# Patient Record
Sex: Male | Born: 1985 | Race: White | Hispanic: No | Marital: Married | Smoking: Former smoker
Health system: Southern US, Community
[De-identification: ages and names within clinical notes are randomized; demographics above are authoritative.]

## PROBLEM LIST (undated history)

## (undated) DIAGNOSIS — T7840XA Allergy, unspecified, initial encounter: Secondary | ICD-10-CM

## (undated) DIAGNOSIS — K900 Celiac disease: Secondary | ICD-10-CM

## (undated) DIAGNOSIS — T40601A Poisoning by unspecified narcotics, accidental (unintentional), initial encounter: Secondary | ICD-10-CM

## (undated) HISTORY — DX: Celiac disease: K90.0

## (undated) HISTORY — DX: Allergy, unspecified, initial encounter: T78.40XA

---

## 2010-08-02 IMAGING — CR DG FOOT COMPLETE 3+V*R*
3 series · 3 of 3 positions shown · non-contrast
Comparison: None.

CLINICAL DATA: Right foot pain.  Injury.

RIGHT FOOT COMPLETE - 3+ VIEW

[AP]
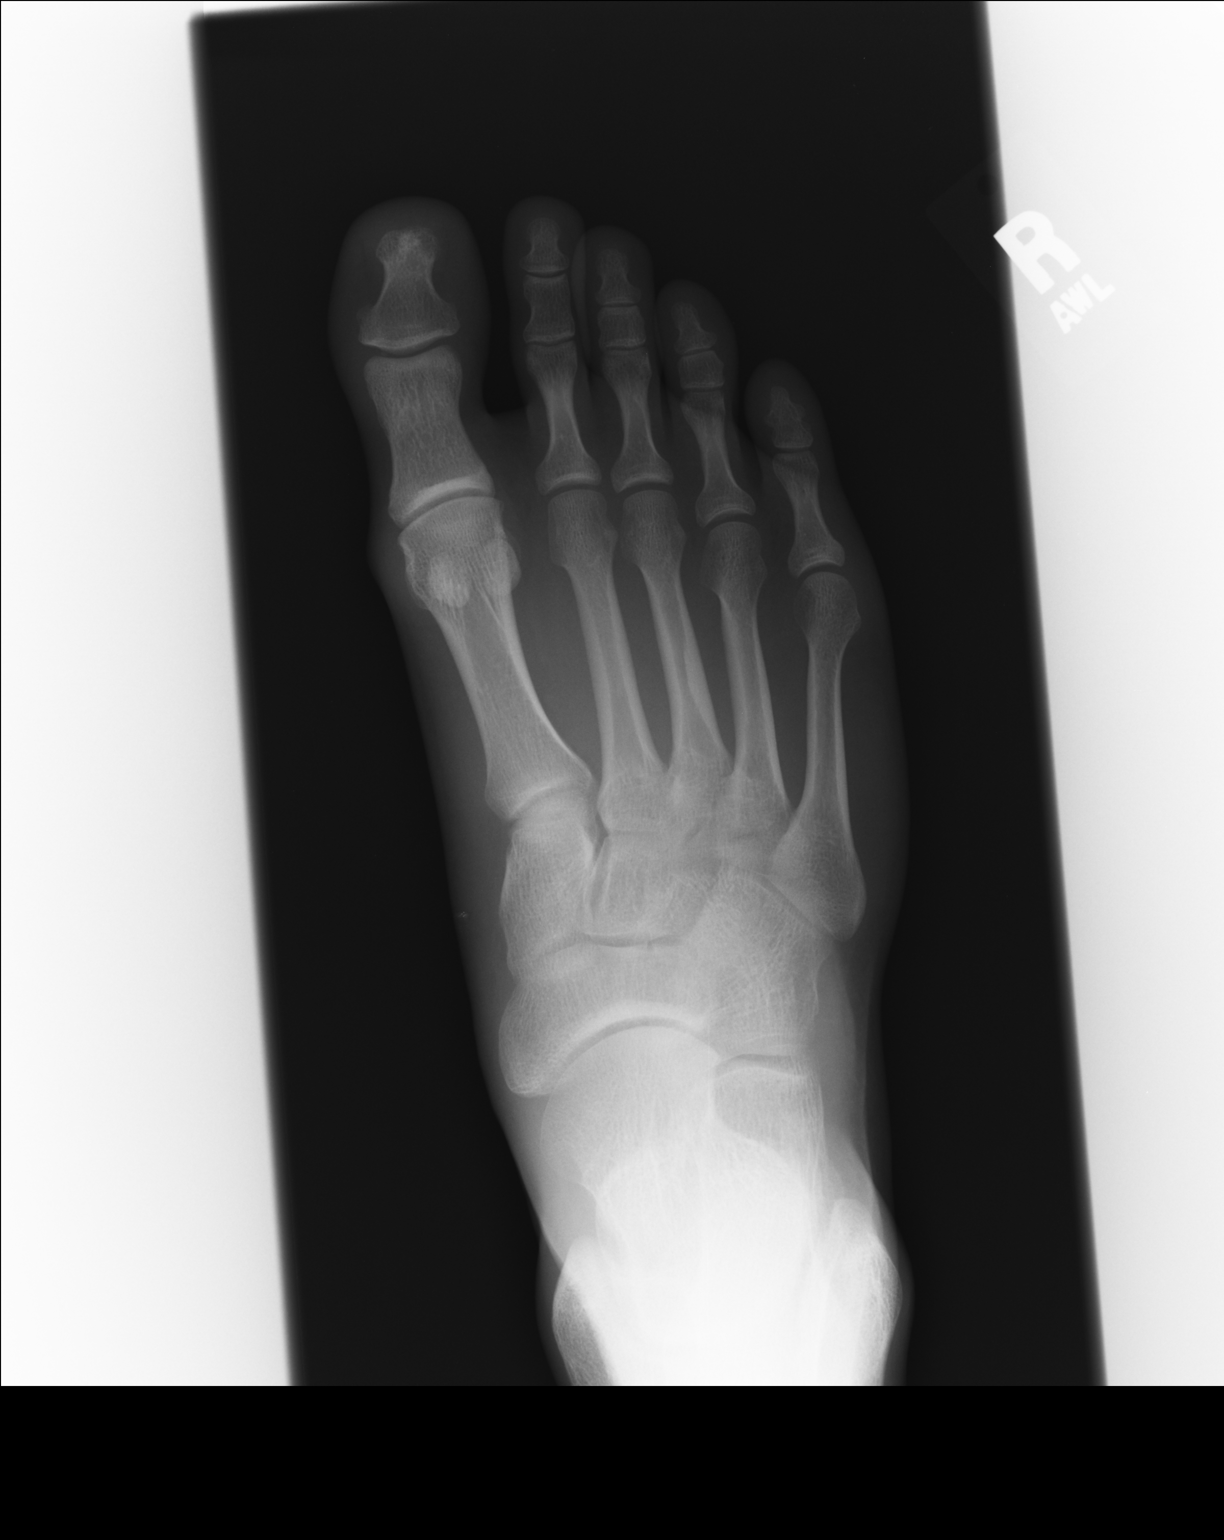

[ap obl int rot]
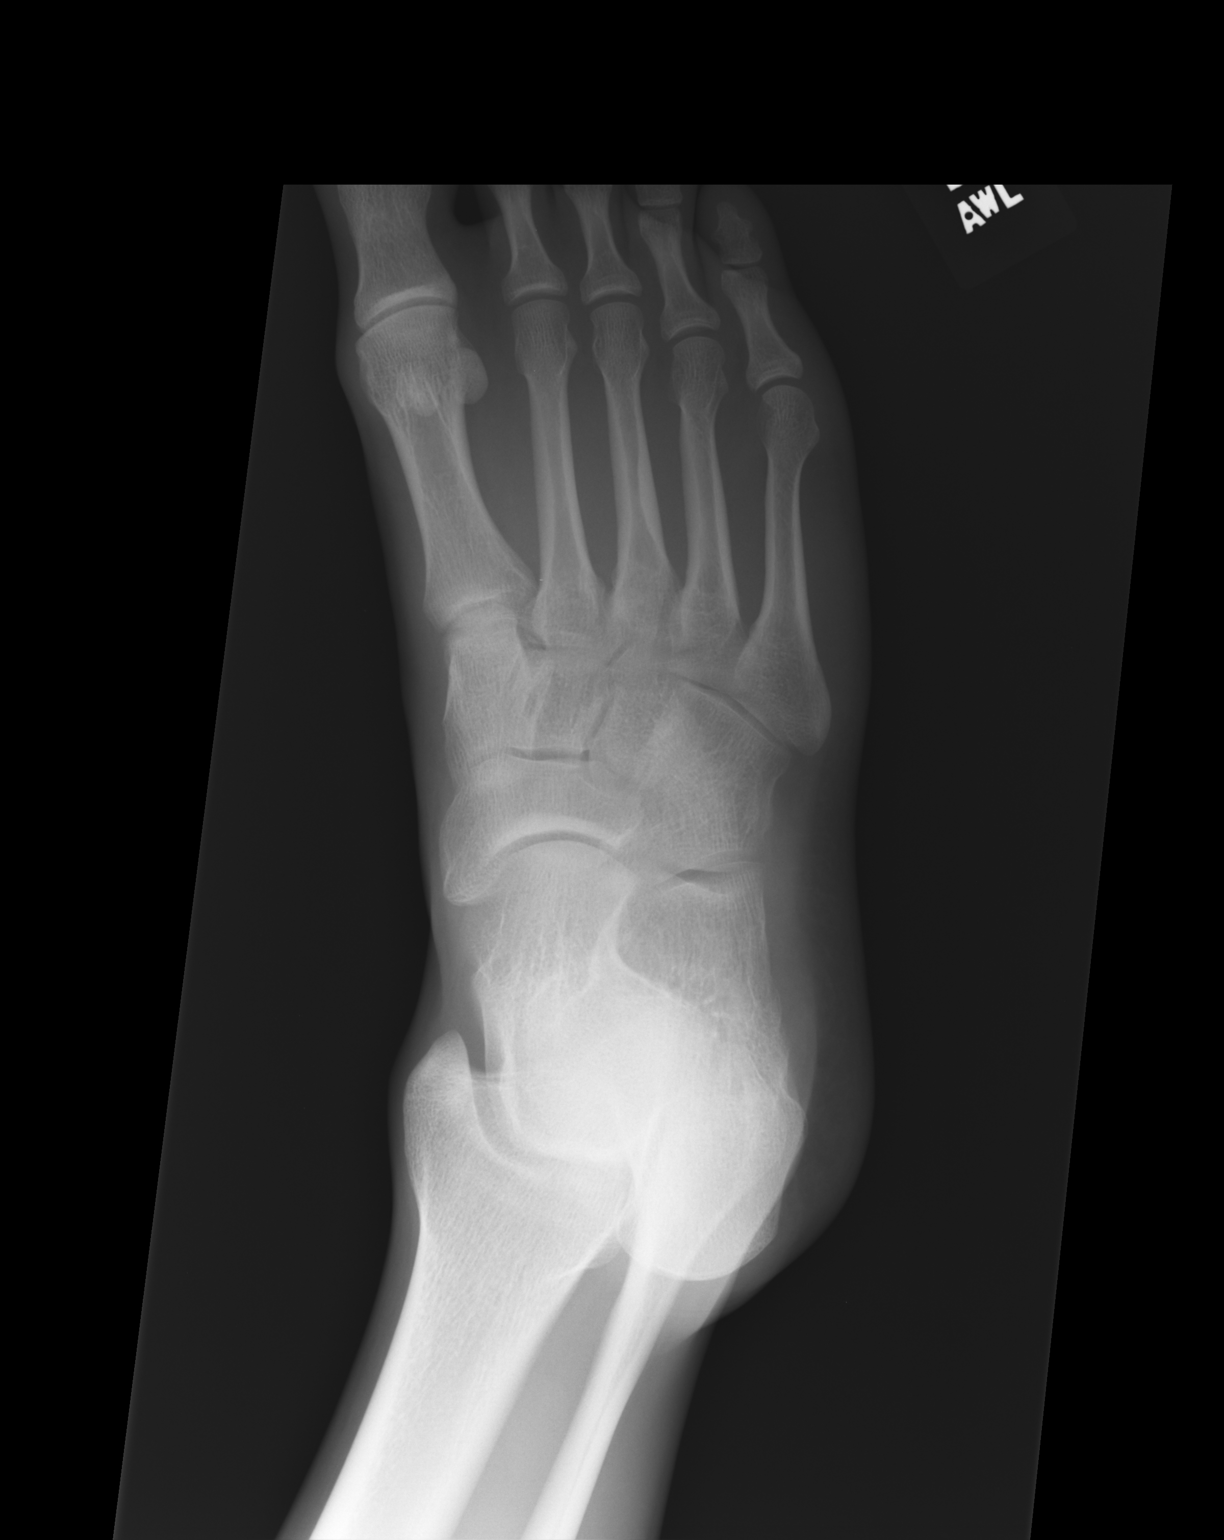

[lateral]
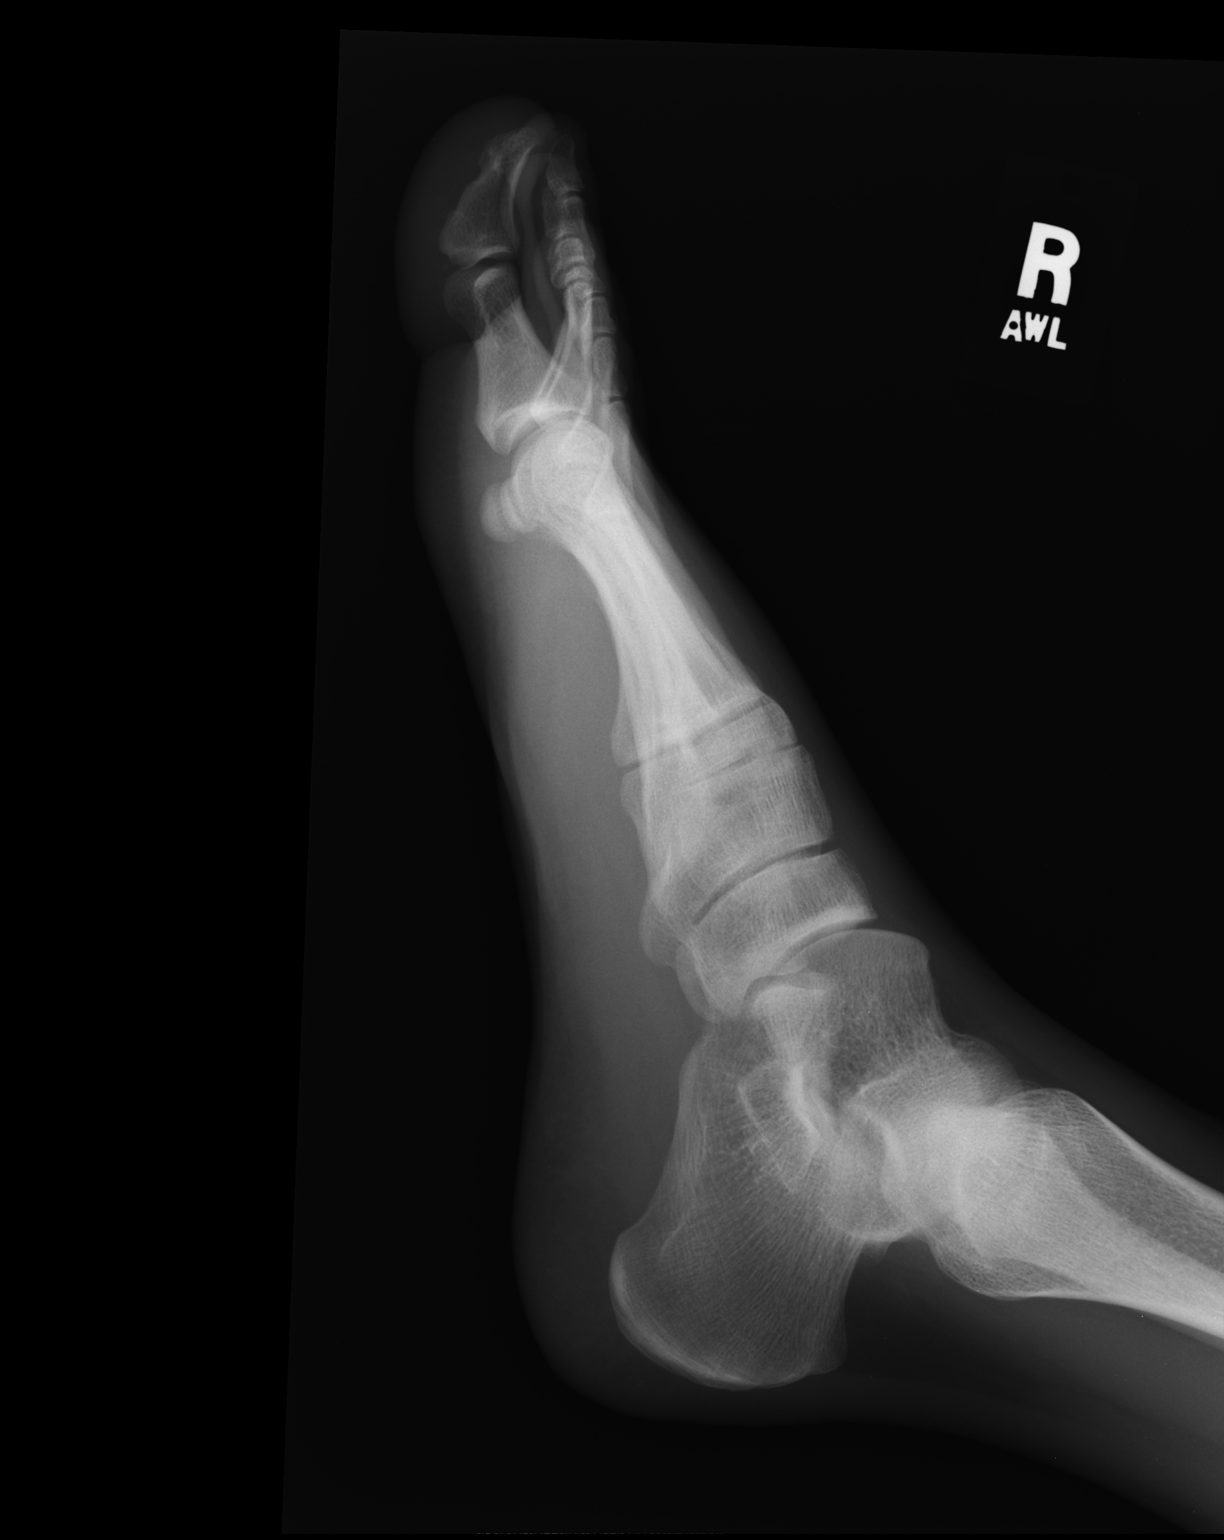

[3 of 3 positions shown; findings below may reference images not displayed]

FINDINGS: Imaged bones, joints and soft tissues appear normal.
IMPRESSION: Negative exam.

## 2011-06-30 ENCOUNTER — Ambulatory Visit: Payer: Self-pay | Admitting: Family Medicine

## 2011-06-30 ENCOUNTER — Ambulatory Visit: Payer: Self-pay

## 2011-06-30 VITALS — BP 122/78 | HR 71 | Temp 97.4°F | Resp 16 | Ht 68.0 in | Wt 144.0 lb

## 2011-06-30 DIAGNOSIS — M79609 Pain in unspecified limb: Secondary | ICD-10-CM

## 2011-06-30 DIAGNOSIS — M79673 Pain in unspecified foot: Secondary | ICD-10-CM

## 2011-06-30 NOTE — Progress Notes (Signed)
  Subjective:    Patient ID: Justin Bass, male    DOB: 21-Apr-1985, 26 y.o.   MRN: [id number]  HPI 26 yo male with right foot pain. Occurred after kicking something hard. Able to walk on it but very painful.  Decreased motioin.  Some swelling.   No history of prior injury.  Has been icing and elevating.  Unable to take ibuprofen (has celiac disease)   Review of Systems Negative except as per HPI     Objective:   Physical Exam  Constitutional: He appears well-developed.  Pulmonary/Chest: Effort normal.  Neurological: He is alert.   Right foot - TTP distal first and second MT.  Subtle swelling and erythema.  Decreased ROM.   [facility] Primary radiology reading by Dr. Georgiana Shore: No fracture seen      Assessment & Plan:  Foot pain - no fracture.  Contusion.  Post-op shoe.  Modified activity for 7-10 days.

## 2012-02-01 ENCOUNTER — Ambulatory Visit: Payer: Self-pay | Admitting: Family Medicine

## 2012-02-01 VITALS — BP 120/78 | HR 74 | Temp 97.9°F | Resp 16 | Ht 68.0 in | Wt 157.0 lb

## 2012-02-01 DIAGNOSIS — R05 Cough: Secondary | ICD-10-CM

## 2012-02-01 DIAGNOSIS — J9801 Acute bronchospasm: Secondary | ICD-10-CM

## 2012-02-01 DIAGNOSIS — J0110 Acute frontal sinusitis, unspecified: Secondary | ICD-10-CM

## 2012-02-01 MED ORDER — DOXYCYCLINE HYCLATE 100 MG PO CAPS: 100.0000 mg | ORAL_CAPSULE | Freq: Two times a day (BID) | ORAL | Status: DC

## 2012-02-01 MED ORDER — ALBUTEROL SULFATE (2.5 MG/3ML) 0.083% IN NEBU: 2.5000 mg | INHALATION_SOLUTION | Freq: Once | RESPIRATORY_TRACT | Status: DC

## 2012-02-01 NOTE — Progress Notes (Signed)
[address]   [place], [place]  [address]   [phone]  Subjective:    Patient ID: Justin Bass, male    DOB: 1985/06/26, 26 y.o.   MRN: [id number]  HPIThis 26 y.o. male presents for evaluation of sore throat.  Onset one week ago.  No fever but +chill/sweats.  +HA  No ear pain. +ST diffusely; +pain with swallowing.  No drooling or spitting.  +rhinorrhea yellow thick green.  +nasal congestion.  +coughing all day; +sputum;; sounds deep; sounds wheezing.  SOB with ambulation.  No vomiting or diarrhea.  No rash.  No sick contacts.  No flu vaccine this year.  Taking Mucinex, Nyquil.   Installs satellite dishes.  Quit smoking two months ago.    Review of Systems  Constitutional: Positive for chills and fatigue. Negative for fever.  HENT: Positive for congestion, sore throat, rhinorrhea, trouble swallowing and voice change. Negative for ear pain.   Respiratory: Positive for cough and shortness of breath. Negative for wheezing.   Gastrointestinal: Negative for nausea, vomiting, abdominal pain and diarrhea.  Skin: Negative for rash.  Neurological: Positive for headaches.        Past Medical History  Diagnosis Date  . Celiac disease   . Celiac disease   . Allergy     No past surgical history on file.  Prior to Admission medications   Medication Sig Start Date End Date Taking? Authorizing Provider  Omega-3 Fatty Acids (FISH OIL PO) Take by mouth.   Yes Historical Provider, MD    No Known Allergies  History   Social History  . Marital Status: Married    Spouse Name: N/A    Number of Children: N/A  . Years of Education: N/A   Occupational History  . Not on file.   Social History Main Topics  . Smoking status: Former Games developer  . Smokeless tobacco: Former Neurosurgeon    Quit date: 06/08/2011  . Alcohol Use: No  . Drug Use: No  . Sexually Active: Not on file   Other Topics Concern  . Not on file   Social History Narrative  . No narrative on file    No family history on  file.  Objective:   Physical Exam  Nursing note and vitals reviewed. Constitutional: He is oriented to person, place, and time. He appears well-developed and well-nourished. No distress.  HENT:  Head: Normocephalic and atraumatic.  Right Ear: External ear normal.  Left Ear: External ear normal.  Nose: Nose normal.  Mouth/Throat: Mucous membranes are normal. No uvula swelling. Posterior oropharyngeal erythema present. No oropharyngeal exudate, posterior oropharyngeal edema or tonsillar abscesses.  Eyes: Conjunctivae normal are normal. Pupils are equal, round, and reactive to light.  Neck: Normal range of motion. Neck supple.  Cardiovascular: Normal rate, regular rhythm and normal heart sounds.   No murmur heard. Pulmonary/Chest: Effort normal. No respiratory distress. He has wheezes. He has no rales.       SCANT B EXPIRATORY WHEEZING; NO TACHYPNEA; NO ACCESSORY MUSCLE USE.    Lymphadenopathy:    He has cervical adenopathy.  Neurological: He is alert and oriented to person, place, and time.  Skin: No rash noted. He is not diaphoretic.  Psychiatric: He has a normal mood and affect. His behavior is normal.    ALBUTEROL NEBULIZER ADMINISTERED DURING VISIT.    Assessment & Plan:   1. Sinusitis, acute frontal  doxycycline (VIBRAMYCIN) 100 MG capsule  2. Cough    3. Bronchospasm, acute  albuterol (PROVENTIL) (2.5  MG/3ML) 0.083% nebulizer solution 2.5 mg     1. Acute sinusitis/bronchitis:  New.  Treat with Doxycycline.  Recommend Mucinex DM or Claritin D for cough, congestion.  Supportive care with rest, fluids, Ibuprofen or Tylenol PRN. 2.  Bronchospasm:  New.  S/p Albuterol nebulizer in office. RTC for worsening or lack of improvement.     Meds ordered this encounter  Medications  . Omega-3 Fatty Acids (FISH OIL PO)    Sig: Take by mouth.  . doxycycline (VIBRAMYCIN) 100 MG capsule    Sig: Take 1 capsule (100 mg total) by mouth 2 (two) times daily.    Dispense:  20 capsule     Refill:  0  . albuterol (PROVENTIL) (2.5 MG/3ML) 0.083% nebulizer solution 2.5 mg    Sig:

## 2012-02-01 NOTE — Patient Instructions (Addendum)
1. Sinusitis, acute frontal  doxycycline (VIBRAMYCIN) 100 MG capsule  2. Cough    3. Bronchospasm, acute  albuterol (PROVENTIL) (2.5 MG/3ML) 0.083% nebulizer solution 2.5 mg

## 2012-05-07 ENCOUNTER — Encounter: Payer: Self-pay | Admitting: Physician Assistant

## 2012-07-17 ENCOUNTER — Ambulatory Visit: Payer: Managed Care, Other (non HMO) | Admitting: Emergency Medicine

## 2012-07-17 VITALS — BP 112/68 | HR 80 | Temp 99.0°F | Resp 16 | Ht 67.5 in | Wt 154.4 lb

## 2012-07-17 DIAGNOSIS — R05 Cough: Secondary | ICD-10-CM

## 2012-07-17 DIAGNOSIS — J209 Acute bronchitis, unspecified: Secondary | ICD-10-CM

## 2012-07-17 MED ORDER — HYDROCOD POLST-CHLORPHEN POLST 10-8 MG/5ML PO LQCR: 5.0000 mL | Freq: Two times a day (BID) | ORAL | Status: DC | PRN

## 2012-07-17 MED ORDER — AZITHROMYCIN 250 MG PO TABS: ORAL_TABLET | ORAL | Status: DC

## 2012-07-17 NOTE — Progress Notes (Signed)
[facility] [address] [place] [address] [phone]  Date:  07/17/2012   Name:  Justin Bass   DOB:  [birth year]   MRN:  [id number]  PCP:  No primary provider on file.    Chief Complaint: Cough and Sore Throat   History of Present Illness:  Justin Bass is a 27 y.o. very pleasant male patient who presents with the following:  4 day history of cough and sore throat.  Has purulent sputum.  No fever or chills. No wheezing or shortness of breath.  No nausea or vomiting.  No stool change.  No improvement with over the counter medications or other home remedies. Denies other complaint or health concern today.   There is no problem list on file for this patient.   Past Medical History  Diagnosis Date  . Celiac disease   . Celiac disease   . Allergy     No past surgical history on file.  History  Substance Use Topics  . Smoking status: Former Games developer  . Smokeless tobacco: Former Neurosurgeon    Quit date: 06/08/2011  . Alcohol Use: No    No family history on file.  No Known Allergies  Medication list has been reviewed and updated.  Current Outpatient Prescriptions on File Prior to Visit  Medication Sig Dispense Refill  . doxycycline (VIBRAMYCIN) 100 MG capsule Take 1 capsule (100 mg total) by mouth 2 (two) times daily.  20 capsule  0  . Omega-3 Fatty Acids (FISH OIL PO) Take by mouth.       No current facility-administered medications on file prior to visit.    Review of Systems:  As per HPI, otherwise negative.    Physical Examination: Filed Vitals:   07/17/12 0927  BP: 112/68  Pulse: 80  Temp: 99 F (37.2 C)  Resp: 16   Filed Vitals:   07/17/12 0927  Height: 5' 7.5" (1.715 m)  Weight: 154 lb 6.4 oz (70.035 kg)   Body mass index is 23.81 kg/(m^2). Ideal Body Weight: Weight in (lb) to have BMI = 25: 161.7  GEN: WDWN, NAD, Non-toxic, A & O x 3 HEENT: Atraumatic, Normocephalic. Neck supple. No masses, No LAD. Ears and Nose: No external  deformity. CV: RRR, No M/G/R. No JVD. No thrill. No extra heart sounds. PULM: CTA B, no wheezes, crackles, rhonchi. No retractions. No resp. distress. No accessory muscle use. ABD: S, NT, ND, +BS. No rebound. No HSM. EXTR: No c/c/e NEURO Normal gait.  PSYCH: Normally interactive. Conversant. Not depressed or anxious appearing.  Calm demeanor.    Assessment and Plan: Bronchitis zpak tussionex   Signed,  Phillips Odor, MD

## 2012-07-17 NOTE — Patient Instructions (Signed)

## 2013-01-30 ENCOUNTER — Ambulatory Visit (INDEPENDENT_AMBULATORY_CARE_PROVIDER_SITE_OTHER): Payer: Managed Care, Other (non HMO) | Admitting: Family Medicine

## 2013-01-30 ENCOUNTER — Encounter: Payer: Self-pay | Admitting: Family Medicine

## 2013-01-30 VITALS — BP 112/82 | HR 65 | Temp 97.7°F | Resp 16 | Ht 67.0 in | Wt 156.0 lb

## 2013-01-30 DIAGNOSIS — Z113 Encounter for screening for infections with a predominantly sexual mode of transmission: Secondary | ICD-10-CM

## 2013-01-30 DIAGNOSIS — Z0000 Encounter for general adult medical examination without abnormal findings: Secondary | ICD-10-CM

## 2013-01-30 DIAGNOSIS — J309 Allergic rhinitis, unspecified: Secondary | ICD-10-CM

## 2013-01-30 LAB — CBC WITH DIFFERENTIAL/PLATELET
Basophils Absolute: 0.1 10*3/uL (ref 0.0–0.1)
Eosinophils Absolute: 0.7 10*3/uL (ref 0.0–0.7)
Eosinophils Relative: 9 % — ABNORMAL HIGH (ref 0–5)
HCT: 46.8 % (ref 39.0–52.0)
Lymphocytes Relative: 30 % (ref 12–46)
Lymphs Abs: 2.3 10*3/uL (ref 0.7–4.0)
MCH: 30.3 pg (ref 26.0–34.0)
MCHC: 35.3 g/dL (ref 30.0–36.0)
MCV: 86 fL (ref 78.0–100.0)
Monocytes Absolute: 0.5 10*3/uL (ref 0.1–1.0)
Monocytes Relative: 7 % (ref 3–12)
Platelets: 183 10*3/uL (ref 150–400)
RBC: 5.44 MIL/uL (ref 4.22–5.81)
WBC: 7.7 10*3/uL (ref 4.0–10.5)

## 2013-01-30 LAB — COMPREHENSIVE METABOLIC PANEL
AST: 26 U/L (ref 0–37)
Alkaline Phosphatase: 89 U/L (ref 39–117)
Creat: 1.1 mg/dL (ref 0.50–1.35)
Glucose, Bld: 84 mg/dL (ref 70–99)
Potassium: 4.1 mEq/L (ref 3.5–5.3)
Sodium: 136 mEq/L (ref 135–145)
Total Bilirubin: 0.7 mg/dL (ref 0.3–1.2)
Total Protein: 7.1 g/dL (ref 6.0–8.3)

## 2013-01-30 LAB — LIPID PANEL
HDL: 54 mg/dL (ref >39)
LDL Cholesterol: 86 mg/dL (ref 0–99)
Total CHOL/HDL Ratio: 2.9 Ratio
Triglycerides: 74 mg/dL (ref <150)

## 2013-01-30 MED ORDER — AZELASTINE HCL 0.1 % NA SOLN: 2.0000 | Freq: Two times a day (BID) | NASAL | Status: DC

## 2013-01-30 NOTE — Progress Notes (Signed)
Subjective:    Patient ID: Justin Bass, male    DOB: 06/21/1985, 27 y.o.   MRN: [id number] Chief Complaint  Patient presents with  . Annual Exam   HPI Does have some seasonal allergies - mainly confined to rhinitis - sniffing all the time.  Tried some Claritin for a while but really didn't seem to help.   Smoked for 7-8 yrs but quit about 1 1/2 yrs prev.   Diagnosed w/ celiac when he was 27 yo.  Got all vaccinations when he moved to the [place] in 2012.  Past Medical History  Diagnosis Date  . Celiac disease   . Celiac disease   . Allergy    No past surgical history on file. Current Outpatient Prescriptions on File Prior to Visit  Medication Sig Dispense Refill  . Omega-3 Fatty Acids (FISH OIL PO) Take by mouth.       No current facility-administered medications on file prior to visit.   No Known Allergies No family history on file. History   Social History  . Marital Status: Married    Spouse Name: N/A    Number of Children: N/A  . Years of Education: N/A   Social History Main Topics  . Smoking status: Former Games developer  . Smokeless tobacco: Former Neurosurgeon    Quit date: 06/08/2011  . Alcohol Use: No  . Drug Use: No  . Sexual Activity: None   Other Topics Concern  . None   Social History Narrative  . None     Review of Systems  Constitutional: Negative.   HENT: Positive for congestion and sneezing.   Eyes: Negative.   Respiratory: Negative.   Cardiovascular: Negative.   Gastrointestinal: Negative.   Endocrine: Negative.   Genitourinary: Negative.   Musculoskeletal: Negative.   Skin: Negative.   Allergic/Immunologic: Negative.   Neurological: Negative.   Hematological: Negative.   Psychiatric/Behavioral: Negative.       BP 112/82  Pulse 65  Temp(Src) 97.7 F (36.5 C) (Oral)  Resp 16  Ht 5\' 7"  (1.702 m)  Wt 156 lb (70.761 kg)  BMI 24.43 kg/m2  SpO2 98% Objective:   Physical Exam  Constitutional: He is oriented to person, place, and time. He appears  well-developed and well-nourished. No distress.  HENT:  Head: Normocephalic and atraumatic.  Right Ear: Tympanic membrane, external ear and ear canal normal.  Left Ear: Tympanic membrane, external ear and ear canal normal.  Nose: Nose normal.  Mouth/Throat: Uvula is midline, oropharynx is clear and moist and mucous membranes are normal. No oropharyngeal exudate.  Eyes: Conjunctivae are normal. Right eye exhibits no discharge. Left eye exhibits no discharge. No scleral icterus.  Neck: Normal range of motion. Neck supple. No thyromegaly present.  Cardiovascular: Normal rate, regular rhythm, normal heart sounds and intact distal pulses.   Pulmonary/Chest: Effort normal and breath sounds normal. No respiratory distress.  Abdominal: Soft. Bowel sounds are normal. He exhibits no distension and no mass. There is no tenderness. There is no rebound and no guarding.  Musculoskeletal: He exhibits no edema.  Lymphadenopathy:    He has no cervical adenopathy.  Neurological: He is alert and oriented to person, place, and time. He has normal reflexes. No cranial nerve deficit. He exhibits normal muscle tone.  Skin: Skin is warm and dry. No rash noted. He is not diaphoretic. No erythema.  Psychiatric: He has a normal mood and affect. His behavior is normal.      Assessment & Plan:   Routine general medical  examination at a health care facility - Plan: Lipid panel, Comprehensive metabolic panel, CBC with Differential, TSH  Screen for STD (sexually transmitted disease) - Plan: GC/Chlamydia Probe Amp, Hepatitis C antibody, RPR, HIV antibody, HSV(herpes simplex vrs) 1+2 ab-IgG  Allergic rhinitis - try below - if needed could consider transitioning to a nasal steroid.  Meds ordered this encounter  Medications  . azelastine (ASTELIN) 137 MCG/SPRAY nasal spray    Sig: Place 2 sprays into the nose 2 (two) times daily. Use in each nostril as directed    Dispense:  30 mL    Refill:  12    Norberto Sorenson, MD  [facility]

## 2013-01-30 NOTE — Patient Instructions (Signed)

## 2013-01-31 LAB — GC/CHLAMYDIA PROBE AMP: CT Probe RNA: NEGATIVE

## 2013-01-31 LAB — HIV ANTIBODY (ROUTINE TESTING W REFLEX): HIV: NONREACTIVE

## 2013-01-31 LAB — HEPATITIS C ANTIBODY: HCV Ab: NEGATIVE

## 2013-02-02 ENCOUNTER — Telehealth: Payer: Self-pay

## 2013-02-02 ENCOUNTER — Ambulatory Visit: Payer: Managed Care, Other (non HMO)

## 2013-02-02 ENCOUNTER — Ambulatory Visit (INDEPENDENT_AMBULATORY_CARE_PROVIDER_SITE_OTHER): Payer: Managed Care, Other (non HMO) | Admitting: Family Medicine

## 2013-02-02 VITALS — BP 130/80 | HR 84 | Temp 98.5°F | Resp 16 | Ht 67.5 in | Wt 157.0 lb

## 2013-02-02 DIAGNOSIS — R05 Cough: Secondary | ICD-10-CM

## 2013-02-02 DIAGNOSIS — J449 Chronic obstructive pulmonary disease, unspecified: Secondary | ICD-10-CM

## 2013-02-02 DIAGNOSIS — J209 Acute bronchitis, unspecified: Secondary | ICD-10-CM

## 2013-02-02 LAB — POCT CBC
HCT, POC: 50 % (ref 43.5–53.7)
Lymph, poc: 2.4 (ref 0.6–3.4)
MCH, POC: 30.9 pg (ref 27–31.2)
MCHC: 32.8 g/dL (ref 31.8–35.4)
MCV: 94.4 fL (ref 80–97)
MID (cbc): 0.6 (ref 0–0.9)
MPV: 12.2 fL (ref 0–99.8)
POC LYMPH PERCENT: 21.9 %L (ref 10–50)
POC MID %: 5.9 %M (ref 0–12)
Platelet Count, POC: 165 10*3/uL (ref 142–424)
RDW, POC: 14.3 %
WBC: 10.8 10*3/uL — AB (ref 4.6–10.2)

## 2013-02-02 LAB — HSV(HERPES SIMPLEX VRS) I + II AB-IGG: HSV 2 Glycoprotein G Ab, IgG: 11.96 IV — ABNORMAL HIGH

## 2013-02-02 IMAGING — CR DG CHEST 2V
2 series · 2 of 2 positions shown · non-contrast
Comparison: None.

CLINICAL DATA: Cough, shortness of breath.

EXAM:
CHEST  2 VIEW

[PA]
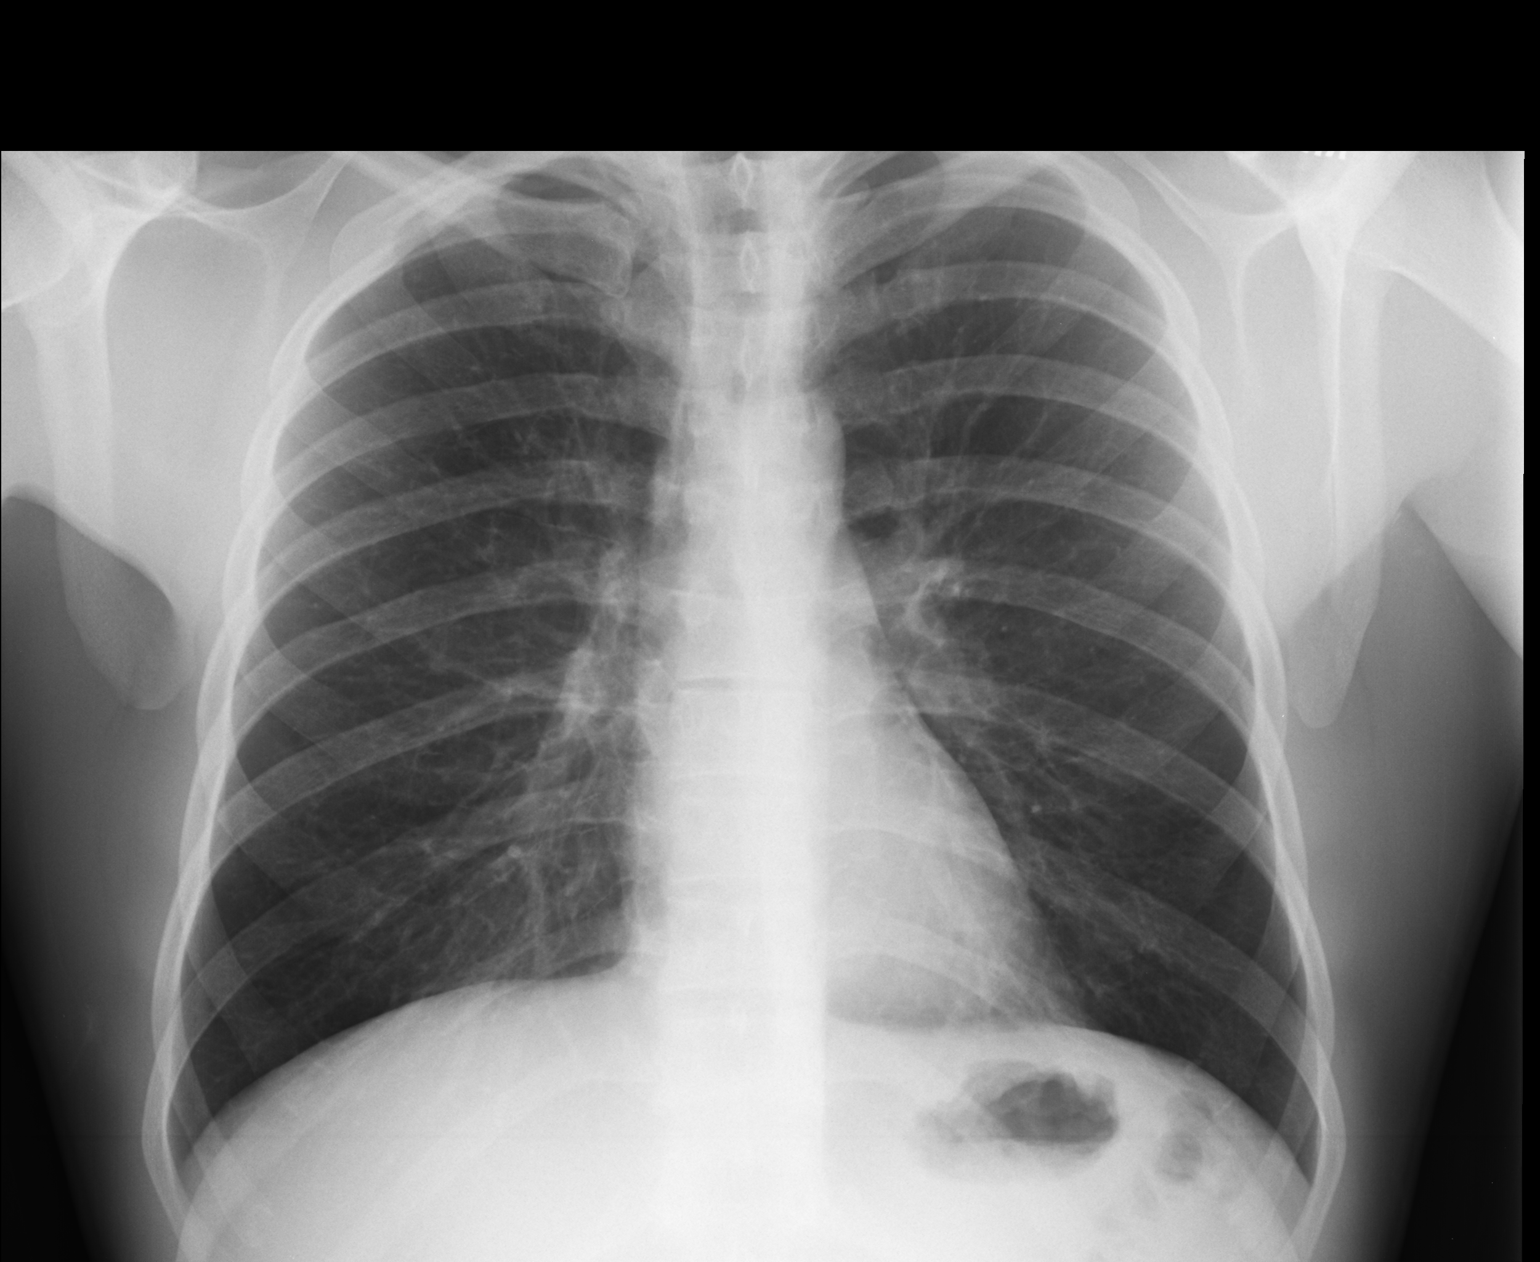

[lateral]
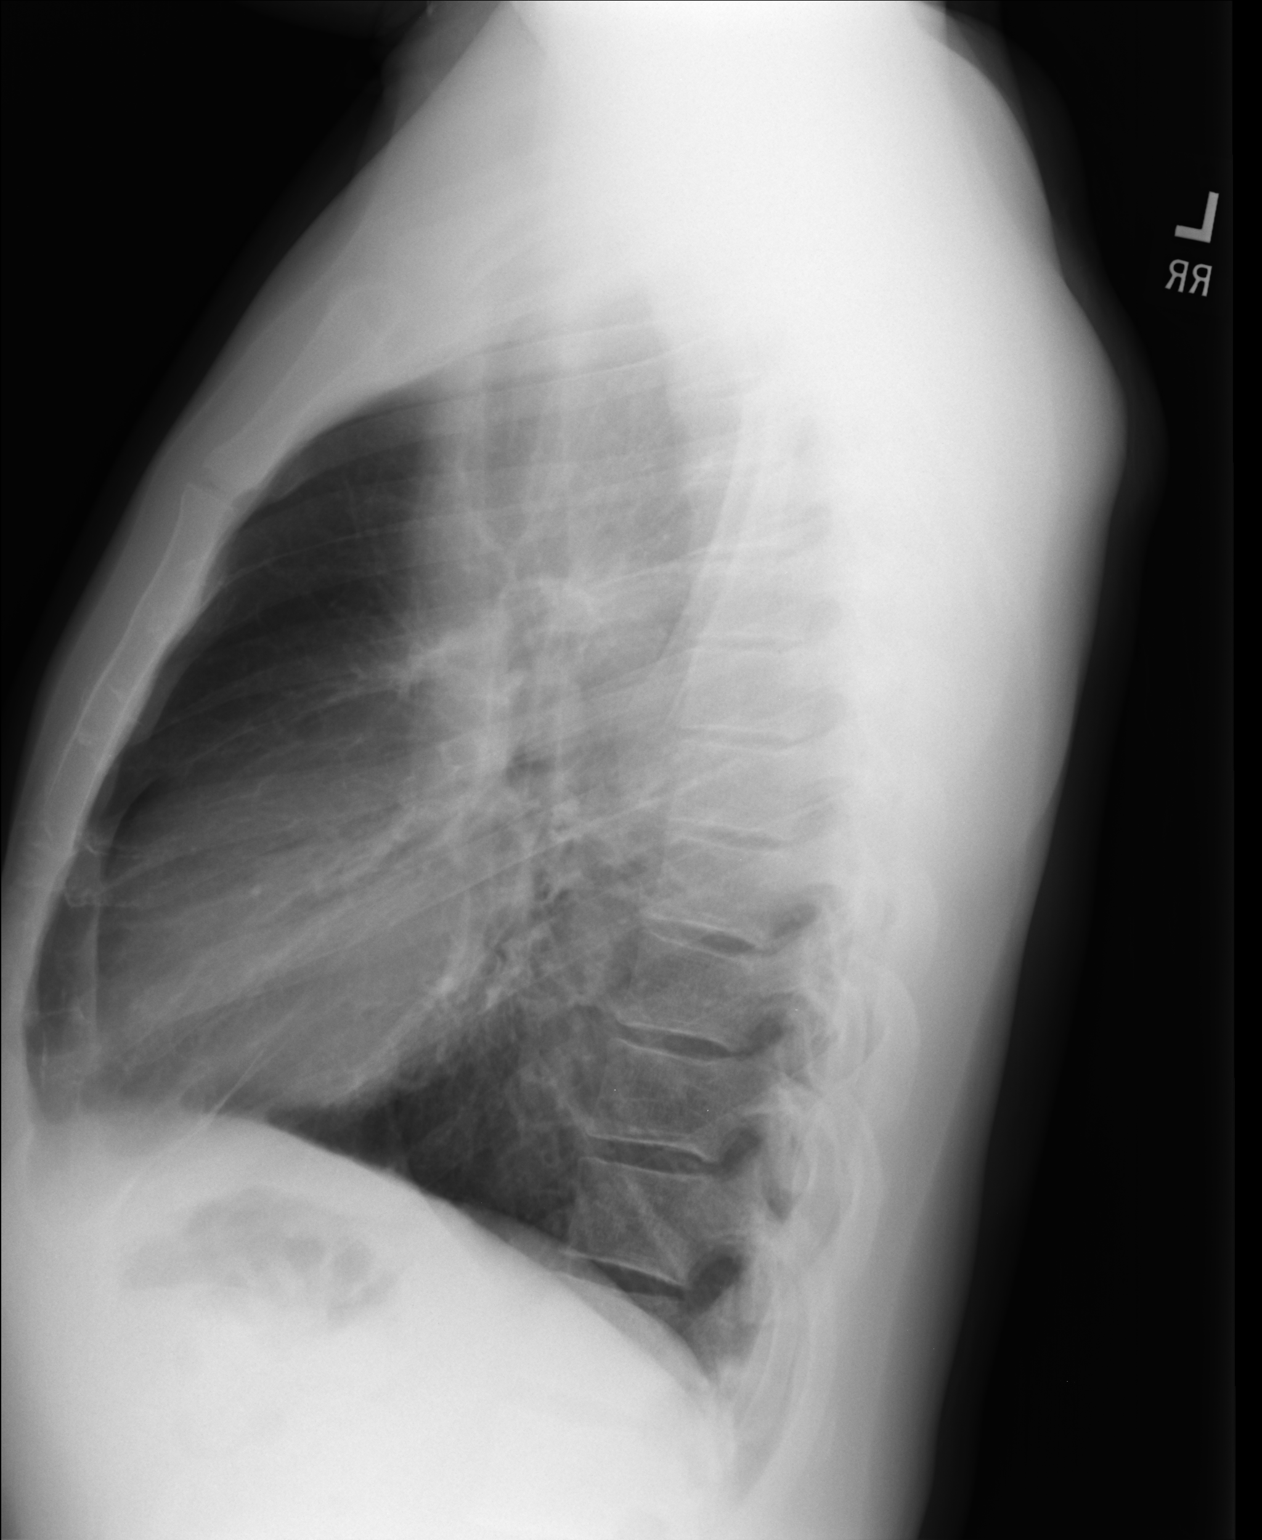

[2 of 2 positions shown; findings below may reference images not displayed]

FINDINGS: The heart size and mediastinal contours are within normal limits.
Both lungs are clear. The visualized skeletal structures are
unremarkable.
IMPRESSION: No active cardiopulmonary disease.

## 2013-02-02 MED ORDER — BENZONATATE 200 MG PO CAPS: 200.0000 mg | ORAL_CAPSULE | Freq: Three times a day (TID) | ORAL | Status: DC | PRN

## 2013-02-02 MED ORDER — ALBUTEROL SULFATE HFA 108 (90 BASE) MCG/ACT IN AERS: 2.0000 | INHALATION_SPRAY | RESPIRATORY_TRACT | Status: DC | PRN

## 2013-02-02 MED ORDER — AZITHROMYCIN 250 MG PO TABS: ORAL_TABLET | ORAL | Status: DC

## 2013-02-02 MED ORDER — ALBUTEROL SULFATE (2.5 MG/3ML) 0.083% IN NEBU
2.5000 mg | INHALATION_SOLUTION | Freq: Once | RESPIRATORY_TRACT | Status: AC
  Administered 2013-02-02: 2.5 mg via RESPIRATORY_TRACT

## 2013-02-02 MED ORDER — HYDROCOD POLST-CHLORPHEN POLST 10-8 MG/5ML PO LQCR: 5.0000 mL | Freq: Two times a day (BID) | ORAL | Status: DC | PRN

## 2013-02-02 NOTE — Telephone Encounter (Signed)
Patient has questions concerning his script written today.    Please call [phone]

## 2013-02-02 NOTE — Telephone Encounter (Signed)
Called him, he wants a different cough med, states the one given he has had in the past and it upset his stomach, he can bring this in to exchange for new Rx.

## 2013-02-02 NOTE — Telephone Encounter (Signed)
He can wait until Thurs to bring the rx in - we will be able to know if he fills it through the database so I trust him to not fill it.  Will send him in some tessalon pearles alternatively. If he needs, I will also be willing to call him in some guaifenesin-codeine syrup. He is planning on a recheck her on Thurs.

## 2013-02-02 NOTE — Progress Notes (Signed)
[address]   [place], [place]  [address]   [phone]  Subjective:    Patient ID: Justin Bass, male    DOB: 1986-03-02, 27 y.o.   MRN: [id number]  Chief Complaint  Patient presents with  . Cough  . Shortness of Breath   This chart was scribed for Norberto Sorenson, MD by Blanchard Kelch, ED Scribe. The patient was seen in room 4. Patient's care was started at 8:19 AM.  HPI  Justin Bass is a 27 y.o. male who presents to office complaining of a constant cough. He has associated shortness of breath and wheezing with his cough. The ShOB is worsened with exertion and cough but not to bad at rest. He has not been sleeping well due to the symptoms. He denies using OTC medications for the symptoms currently. He was using Dayquil at first but it was providing no relief. He was seen by me on 10/24 for a complete physical exam and was diagnosed w/ allergic rhinitis at that time but since then he has sig worsened. He denies a history of asthma and has never used an inhaler in the past though does have distant smoking hx. He denies fever, chills, chest pain, nasal congestion or sinus pressure.    Past Medical History  Diagnosis Date  . Celiac disease   . Celiac disease   . Allergy    Current Outpatient Prescriptions on File Prior to Visit  Medication Sig Dispense Refill  . azelastine (ASTELIN) 137 MCG/SPRAY nasal spray Place 2 sprays into the nose 2 (two) times daily. Use in each nostril as directed  30 mL  12  . Omega-3 Fatty Acids (FISH OIL PO) Take by mouth.       No current facility-administered medications on file prior to visit.   No Known Allergies   Review of Systems  Constitutional: Positive for fatigue. Negative for fever, chills, diaphoresis, activity change, appetite change and unexpected weight change.  HENT: Positive for postnasal drip and rhinorrhea. Negative for congestion, ear pain, mouth sores, sinus pressure and sore throat.   Respiratory: Positive for cough, chest tightness,  shortness of breath and wheezing.   Cardiovascular: Negative for chest pain.  Musculoskeletal: Negative for arthralgias, myalgias, neck pain and neck stiffness.  Skin: Negative for rash.  Neurological: Negative for syncope and headaches.  Hematological: Negative for adenopathy.  Psychiatric/Behavioral: Positive for sleep disturbance.      BP 130/80  Pulse 84  Temp(Src) 98.5 F (36.9 C) (Oral)  Resp 16  Ht 5' 7.5" (1.715 m)  Wt 157 lb (71.215 kg)  BMI 24.21 kg/m2  SpO2 95% Objective:   Physical Exam  Nursing note and vitals reviewed. Constitutional: He is oriented to person, place, and time. He appears well-developed and well-nourished. No distress.  HENT:  Head: Normocephalic and atraumatic.  Right Ear: Tympanic membrane, external ear and ear canal normal.  Left Ear: Tympanic membrane, external ear and ear canal normal.  Nose: Mucosal edema present. No rhinorrhea.  Mouth/Throat: Uvula is midline and mucous membranes are normal. Posterior oropharyngeal erythema present. No oropharyngeal exudate or posterior oropharyngeal edema.  Eyes: EOM are normal.  Neck: Neck supple. No tracheal deviation, no edema and normal range of motion present.  Cardiovascular: Normal rate, regular rhythm, S1 normal, S2 normal and normal heart sounds.   No murmur heard. Pulmonary/Chest: He has wheezes in the right lower field and the left lower field. He has rhonchi in the right lower field and the left lower field. He  has rales in the right upper field and the left upper field.  Musculoskeletal: Normal range of motion.  Lymphadenopathy:       Head (right side): Tonsillar adenopathy present. No submandibular, no preauricular and no posterior auricular adenopathy present.       Head (left side): Tonsillar adenopathy present. No submandibular, no preauricular and no posterior auricular adenopathy present.    He has cervical adenopathy.       Right cervical: Superficial cervical adenopathy present. No deep  cervical and no posterior cervical adenopathy present.      Left cervical: Superficial cervical adenopathy present. No deep cervical and no posterior cervical adenopathy present.       Right: No supraclavicular adenopathy present.       Left: No supraclavicular adenopathy present.  Neurological: He is alert and oriented to person, place, and time.  Skin: Skin is warm and dry.  Psychiatric: He has a normal mood and affect. His behavior is normal.   Results for orders placed in visit on 02/02/13  POCT CBC      Result Value Range   WBC 10.8 (*) 4.6 - 10.2 K/uL   Lymph, poc 2.4  0.6 - 3.4   POC LYMPH PERCENT 21.9  10 - 50 %L   MID (cbc) 0.6  0 - 0.9   POC MID % 5.9  0 - 12 %M   POC Granulocyte 7.8 (*) 2 - 6.9   Granulocyte percent 74.2  37 - 80 %G   RBC 5.30  4.69 - 6.13 M/uL   Hemoglobin 16.4  14.1 - 18.1 g/dL   HCT, POC 16.1  09.6 - 53.7 %   MCV 94.4  80 - 97 fL   MCH, POC 30.9  27 - 31.2 pg   MCHC 32.8  31.8 - 35.4 g/dL   RDW, POC 04.5     Platelet Count, POC 165  142 - 424 K/uL   MPV 12.2  0 - 99.8 fL    Predicted peak flow is 615.  [facility] reading (primary) by Dr. Clelia Croft: No acute abnormality on chest x-ray.    Assessment & Plan:  9:02 AM- Patient is feeling better after breathing treatment. Few mild expiratory rhonchi are present diffusely upon PE recheck but overall MUCH improved after alb neb treat. Discussed plan to prescribe antibiotics, inhaler and cough medication. Recommend staying out of work until symptoms subside with a follow up on Thursday, 10/30. Despite reassuring cbc and cxr, I am concerned that pt may be developing CAPna due to his very poor pulmonary exam and SHoB.  Patient verbalizes understanding and agrees with treatment plan. Cough - Plan: POCT CBC, DG Chest 2 View, albuterol (PROVENTIL) (2.5 MG/3ML) 0.083% nebulizer solution 2.5 mg  Acute bronchitis  Meds ordered this encounter  Medications  . albuterol (PROVENTIL) (2.5 MG/3ML) 0.083% nebulizer solution 2.5  mg    Sig:   . chlorpheniramine-HYDROcodone (TUSSIONEX PENNKINETIC ER) 10-8 MG/5ML LQCR    Sig: Take 5 mLs by mouth every 12 (twelve) hours as needed (cough).    Dispense:  140 mL    Refill:  0  . azithromycin (ZITHROMAX) 250 MG tablet    Sig: Take 2 tabs PO x 1 dose, then 1 tab PO QD x 4 days    Dispense:  6 tablet    Refill:  0  . albuterol (PROVENTIL HFA;VENTOLIN HFA) 108 (90 BASE) MCG/ACT inhaler    Sig: Inhale 2 puffs into the lungs every 4 (four) hours as needed for  wheezing (cough, shortness of breath or wheezing.).    Dispense:  1 Inhaler    Refill:  1    I personally performed the services described in this documentation, which was scribed in my presence. The recorded information has been reviewed and considered, and addended by me as needed.  Norberto Sorenson, MD MPH

## 2013-02-02 NOTE — Patient Instructions (Signed)

## 2013-02-03 NOTE — Telephone Encounter (Signed)
Advised tablets sent in, he is asking for something without codeine.

## 2013-02-04 NOTE — Telephone Encounter (Signed)
Then the tessalon pearles should do it. Or can use otc Delsym.

## 2013-02-04 NOTE — Telephone Encounter (Signed)
Thanks he was advised to use pearles call back if this not helpful

## 2013-02-05 ENCOUNTER — Ambulatory Visit (INDEPENDENT_AMBULATORY_CARE_PROVIDER_SITE_OTHER): Payer: Managed Care, Other (non HMO) | Admitting: Family Medicine

## 2013-02-05 ENCOUNTER — Encounter: Payer: Self-pay | Admitting: Family Medicine

## 2013-02-05 VITALS — BP 118/70 | HR 66 | Temp 98.8°F | Resp 16 | Ht 68.25 in | Wt 156.0 lb

## 2013-02-05 DIAGNOSIS — R05 Cough: Secondary | ICD-10-CM

## 2013-02-05 LAB — POCT CBC
Granulocyte percent: 58.5 %G (ref 37–80)
HCT, POC: 49.5 % (ref 43.5–53.7)
Hemoglobin: 16 g/dL (ref 14.1–18.1)
Lymph, poc: 2.6 (ref 0.6–3.4)
MPV: 12.2 fL (ref 0–99.8)
POC Granulocyte: 4.8 (ref 2–6.9)
POC MID %: 9.4 %M (ref 0–12)
RBC: 5.22 M/uL (ref 4.69–6.13)

## 2013-02-05 NOTE — Progress Notes (Signed)
This chart was scribed for Justin Sorenson, MD by Ardelia Mems, Scribe. This patient was seen in room 9 and the patient's care was started at 11:24 AM.  Subjective:    Patient ID: Justin Bass, male    DOB: [birth year], 27 y.o.   MRN: [id number]  Chief Complaint  Patient presents with  . Follow-up    Pneumonia    HPI  HPI Comments: Justin Bass is a 28 y.o. male who presents to the [facility] for a follow-up evaluation after being seen 3 days ago - officially diagnosed w/ brochitis as he had a normal CXR but I was concerned that he was developing pneumonia due to the rapid onset of his sxs inc ShoB and abnormal pulm exam. Pt was placed on a Z-pak with an albuterol inhaler.  He states that he is now coughing more and seems to be getting more mucous up, and has mild ShOB after coughing fits though [facility] overall much improved. He states that he has had some relief with his inhaler. He states that he has chest pain with coughing. He denies fever or chills. Overall seems to be feeling much better and the tessalon pearles are helping.   Past Medical History  Diagnosis Date  . Celiac disease   . Celiac disease   . Allergy    Current Outpatient Prescriptions on File Prior to Visit  Medication Sig Dispense Refill  . albuterol (PROVENTIL HFA;VENTOLIN HFA) 108 (90 BASE) MCG/ACT inhaler Inhale 2 puffs into the lungs every 4 (four) hours as needed for wheezing (cough, shortness of breath or wheezing.).  1 Inhaler  1  . azelastine (ASTELIN) 137 MCG/SPRAY nasal spray Place 2 sprays into the nose 2 (two) times daily. Use in each nostril as directed  30 mL  12  . azithromycin (ZITHROMAX) 250 MG tablet Take 2 tabs PO x 1 dose, then 1 tab PO QD x 4 days  6 tablet  0  . benzonatate (TESSALON) 200 MG capsule Take 1 capsule (200 mg total) by mouth 3 (three) times daily as needed for cough.  30 capsule  0  . Omega-3 Fatty Acids (FISH OIL PO) Take by mouth.       No current facility-administered medications on file prior to  visit.   No Known Allergies   Review of Systems  Constitutional: Positive for activity change, appetite change and fatigue. Negative for fever and chills.  HENT: Negative for congestion, ear discharge, ear pain, postnasal drip, rhinorrhea, sinus pressure, sneezing, sore throat and trouble swallowing.   Respiratory: Positive for cough and shortness of breath. Negative for wheezing.   Cardiovascular: Positive for chest pain (with coughing).  Gastrointestinal: Negative for nausea, vomiting, abdominal pain, diarrhea and constipation.  Genitourinary: Negative for dysuria and difficulty urinating.  Musculoskeletal: Negative for arthralgias, myalgias, neck pain and neck stiffness.  Hematological: Negative for adenopathy.  Psychiatric/Behavioral: Positive for sleep disturbance.      BP 118/70  Pulse 66  Temp(Src) 98.8 F (37.1 C) (Oral)  Resp 16  Ht 5' 8.25" (1.734 m)  Wt 156 lb (70.761 kg)  BMI 23.53 kg/m2  SpO2 96% Objective:   Physical Exam  Nursing note and vitals reviewed. Constitutional: He is oriented to person, place, and time. He appears well-developed and well-nourished. No distress.  HENT:  Head: Normocephalic and atraumatic.  Right Ear: Tympanic membrane is not erythematous and not bulging. No middle ear effusion.  Left Ear: Tympanic membrane is not erythematous and not bulging.  No middle ear effusion.  Nose: Rhinorrhea present. No mucosal edema.  Mouth/Throat: Uvula is midline and mucous membranes are normal. Posterior oropharyngeal erythema present. No oropharyngeal exudate or posterior oropharyngeal edema.  Eyes: EOM are normal.  Neck: Neck supple. No tracheal deviation present.  Cardiovascular: Normal rate.   Pulmonary/Chest: Effort normal. No respiratory distress. He has wheezes. He has rales.  Bilateral upper lobe inspiratory and expiratory rales. Bibasilar inspiratory and expiratory wheezing.  Musculoskeletal: Normal range of motion.  Lymphadenopathy:       Head  (right side): Tonsillar adenopathy present. No preauricular and no posterior auricular adenopathy present.       Head (left side): Tonsillar adenopathy present. No posterior auricular adenopathy present.    He has cervical adenopathy.       Right cervical: Superficial cervical adenopathy present.       Left cervical: Superficial cervical adenopathy present.       Right: No supraclavicular adenopathy present.       Left: No supraclavicular adenopathy present.  Neurological: He is alert and oriented to person, place, and time.  Skin: Skin is warm and dry.  Psychiatric: He has a normal mood and affect. His behavior is normal.    Results for orders placed in visit on 02/05/13  POCT CBC      Result Value Range   WBC 8.2  4.6 - 10.2 K/uL   Lymph, poc 2.6  0.6 - 3.4   POC LYMPH PERCENT 32.1  10 - 50 %L   MID (cbc) 0.8  0 - 0.9   POC MID % 9.4  0 - 12 %M   POC Granulocyte 4.8  2 - 6.9   Granulocyte percent 58.5  37 - 80 %G   RBC 5.22  4.69 - 6.13 M/uL   Hemoglobin 16.0  14.1 - 18.1 g/dL   HCT, POC 16.1  09.6 - 53.7 %   MCV 94.9  80 - 97 fL   MCH, POC 30.7  27 - 31.2 pg   MCHC 32.3  31.8 - 35.4 g/dL   RDW, POC 04.5     Platelet Count, POC 152  142 - 424 K/uL   MPV 12.2  0 - 99.8 fL    Assessment & Plan:  Advised patient of normal blood work findings. Seems to be improving clinically as well. Finish course of antibiotic. Ok to return to work. Advised patient to return in 1 week if symptoms persist. Cough - Plan: POCT CBC -   No orders of the defined types were placed in this encounter.    I personally performed the services described in this documentation, which was scribed in my presence. The recorded information has been reviewed and considered, and addended by me as needed.  Justin Sorenson, MD MPH

## 2013-02-05 NOTE — Patient Instructions (Signed)

## 2013-02-06 MED ORDER — VALACYCLOVIR HCL 500 MG PO TABS: 500.0000 mg | ORAL_TABLET | Freq: Every day | ORAL | Status: DC

## 2013-02-06 NOTE — Addendum Note (Signed)
Addended by: Norberto Sorenson on: 02/06/2013 03:49 PM   Modules accepted: Orders

## 2013-02-11 ENCOUNTER — Ambulatory Visit (INDEPENDENT_AMBULATORY_CARE_PROVIDER_SITE_OTHER): Payer: Managed Care, Other (non HMO) | Admitting: Family Medicine

## 2013-02-11 VITALS — BP 98/64 | HR 74 | Temp 97.9°F | Resp 18 | Wt 156.0 lb

## 2013-02-11 DIAGNOSIS — J683 Other acute and subacute respiratory conditions due to chemicals, gases, fumes and vapors: Secondary | ICD-10-CM

## 2013-02-11 DIAGNOSIS — J45909 Unspecified asthma, uncomplicated: Secondary | ICD-10-CM

## 2013-02-11 DIAGNOSIS — J45998 Other asthma: Secondary | ICD-10-CM

## 2013-02-11 MED ORDER — PREDNISONE 20 MG PO TABS: ORAL_TABLET | ORAL | Status: DC

## 2013-02-11 MED ORDER — BECLOMETHASONE DIPROPIONATE 40 MCG/ACT IN AERS: 2.0000 | INHALATION_SPRAY | Freq: Two times a day (BID) | RESPIRATORY_TRACT | Status: DC

## 2013-02-11 MED ORDER — ALBUTEROL SULFATE HFA 108 (90 BASE) MCG/ACT IN AERS: 2.0000 | INHALATION_SPRAY | RESPIRATORY_TRACT | Status: DC | PRN

## 2013-02-11 NOTE — Patient Instructions (Signed)
Use the Qvar inhaler 2 puffs twice daily  Use the albuterol inhaler 2 puffs every 4-6 hours when needed new  Take the prednisone 3 tablets daily for 2 days, then 2 tablets daily for 2 days, then one tablet daily for 2 days, then one half tablet daily for 4 days  In the event of acute worsening call 911  Return in 2-3 weeks

## 2013-02-11 NOTE — Progress Notes (Signed)
Subjective: Patient has been here several times in the last couple of weeks. He had breathing problems and bronchitis and question whether he could have had a pneumonia. Chest x-ray was clear. Blood count was a little elevated initially but came back down. He improved on antibiotics and the inhaler, though he was using the inhaler excessively recurrently, as often as a dozen times a day. He works at [employer]. He works out regularly at [employer]. Prior to getting ill he was breathing fine. Monday he started getting worse again. He had run out of his inhaler. He down to the gym and came home unable to get a deep breath. That has persisted. No fevers.  Objective: Alert oriented man but speaks with a voice that is restricted some by shortness of breath. He is throat is clear. Neck supple without nodes. Chest clear to percussion.. Has a mild end expiratory wheeze, especially on forced expiration. Air exchange is suboptimal. Peak flow was 500 with a predicted maximum of 600.  Assessment: Reactive airways disease Dyspnea  Plan: Albuterol nebulizer Check response to nebulizer  Repeat peak flow was 550.  Will treat with tapered prednisone, Qvar, and albuterol. See instructions

## 2013-02-12 ENCOUNTER — Other Ambulatory Visit: Payer: Self-pay

## 2013-03-12 ENCOUNTER — Telehealth: Payer: Self-pay

## 2013-03-12 ENCOUNTER — Ambulatory Visit: Payer: Managed Care, Other (non HMO) | Admitting: Internal Medicine

## 2013-03-12 ENCOUNTER — Encounter: Payer: Self-pay | Admitting: Internal Medicine

## 2013-03-12 VITALS — BP 108/66 | HR 72 | Temp 98.4°F | Resp 18 | Ht 68.0 in | Wt 159.0 lb

## 2013-03-12 DIAGNOSIS — J45901 Unspecified asthma with (acute) exacerbation: Secondary | ICD-10-CM

## 2013-03-12 DIAGNOSIS — J9801 Acute bronchospasm: Secondary | ICD-10-CM

## 2013-03-12 DIAGNOSIS — T7840XA Allergy, unspecified, initial encounter: Secondary | ICD-10-CM

## 2013-03-12 MED ORDER — FLUTICASONE-SALMETEROL 250-50 MCG/DOSE IN AEPB: 1.0000 | INHALATION_SPRAY | Freq: Two times a day (BID) | RESPIRATORY_TRACT | Status: DC

## 2013-03-12 MED ORDER — CETIRIZINE HCL 10 MG PO TABS: 10.0000 mg | ORAL_TABLET | Freq: Every day | ORAL | Status: DC

## 2013-03-12 MED ORDER — METHYLPREDNISOLONE ACETATE 80 MG/ML IJ SUSP
120.0000 mg | Freq: Once | INTRAMUSCULAR | Status: AC
  Administered 2013-03-12: 120 mg via INTRAMUSCULAR

## 2013-03-12 MED ORDER — FLUTICASONE PROPIONATE 50 MCG/ACT NA SUSP: 2.0000 | Freq: Every day | NASAL | Status: DC

## 2013-03-12 NOTE — Patient Instructions (Signed)
Asthma, Adult Asthma is a recurring condition in which the airways tighten and narrow. Asthma can make it difficult to breathe. It can cause coughing, wheezing, and shortness of breath. Asthma episodes (also called asthma attacks) range from minor to life-threatening. Asthma cannot be cured, but medicines and lifestyle changes can help control it. CAUSES Asthma is believed to be caused by inherited (genetic) and environmental factors, but its exact cause is unknown. Asthma may be triggered by allergens, lung infections, or irritants in the air. Asthma triggers are different for each person. Common triggers include:   Animal dander.  Dust mites.  Cockroaches.  Pollen from trees or grass.  Mold.  Smoke.  Air pollutants such as dust, household cleaners, hair sprays, aerosol sprays, paint fumes, strong chemicals, or strong odors.  Cold air, weather changes, and winds (which increase molds and pollens in the air).  Strong emotional expressions such as crying or laughing hard.  Stress.  Certain medicines (such as aspirin) or types of drugs (such as beta-blockers).  Sulfites in foods and drinks. Foods and drinks that may contain sulfites include dried fruit, potato chips, and sparkling grape juice.  Infections or inflammatory conditions such as the flu, a cold, or an inflammation of the nasal membranes (rhinitis).  Gastroesophageal reflux disease (GERD).  Exercise or strenuous activity. SYMPTOMS Symptoms may occur immediately after asthma is triggered or many hours later. Symptoms include:  Wheezing.  Excessive nighttime or early morning coughing.  Frequent or severe coughing with a common cold.  Chest tightness.  Shortness of breath. DIAGNOSIS  The diagnosis of asthma is made by a review of your medical history and a physical exam. Tests may also be performed. These may include:  Lung function studies. These tests show how much air you breath in and out.  Allergy  tests.  Imaging tests such as X-rays. TREATMENT  Asthma cannot be cured, but it can usually be controlled. Treatment involves identifying and avoiding your asthma triggers. It also involves medicines. There are 2 classes of medicine used for asthma treatment:   Controller medicines. These prevent asthma symptoms from occurring. They are usually taken every day.  Reliever or rescue medicines. These quickly relieve asthma symptoms. They are used as needed and provide short-term relief. Your health care provider will help you create an asthma action plan. An asthma action plan is a written plan for managing and treating your asthma attacks. It includes a list of your asthma triggers and how they may be avoided. It also includes information on when medicines should be taken and when their dosage should be changed. An action plan may also involve the use of a device called a peak flow meter. A peak flow meter measures how well the lungs are working. It helps you monitor your condition. HOME CARE INSTRUCTIONS   Take medicine as directed by your health care provider. Speak with your health care provider if you have questions about how or when to take the medicines.  Use a peak flow meter as directed by your health care provider. Record and keep track of readings.  Understand and use the action plan to help minimize or stop an asthma attack without needing to seek medical care.  Control your home environment in the following ways to help prevent asthma attacks:  Do not smoke. Avoid being exposed to secondhand smoke.  Change your heating and air conditioning filter regularly.  Limit your use of fireplaces and wood stoves.  Get rid of pests (such as roaches and   mice) and their droppings.  Throw away plants if you see mold on them.  Clean your floors and dust regularly. Use unscented cleaning products.  Try to have someone else vacuum for you regularly. Stay out of rooms while they are being  vacuumed and for a short while afterward. If you vacuum, use a dust mask from a hardware store, a double-layered or microfilter vacuum cleaner bag, or a vacuum cleaner with a HEPA filter.  Replace carpet with wood, tile, or vinyl flooring. Carpet can trap dander and dust.  Use allergy-proof pillows, mattress covers, and box spring covers.  Wash bed sheets and blankets every week in hot water and dry them in a dryer.  Use blankets that are made of polyester or cotton.  Clean bathrooms and kitchens with bleach. If possible, have someone repaint the walls in these rooms with mold-resistant paint. Keep out of the rooms that are being cleaned and painted.  Wash hands frequently. SEEK MEDICAL CARE IF:   You have wheezing, shortness of breath, or a cough even if taking medicine to prevent attacks.  The colored mucus you cough up (sputum) is thicker than usual.  Your sputum changes from clear or white to yellow, green, gray, or bloody.  You have any problems that may be related to the medicines you are taking (such as a rash, itching, swelling, or trouble breathing).  You are using a reliever medicine more than 2 3 times per week.  Your peak flow is still at 50 79% of you personal best after following your action plan for 1 hour. SEEK IMMEDIATE MEDICAL CARE IF:   You seem to be getting worse and are unresponsive to treatment during an asthma attack.  You are short of breath even at rest.  You get short of breath when doing very little physical activity.  You have difficulty eating, drinking, or talking due to asthma symptoms.  You develop chest pain.  You develop a fast heartbeat.  You have a bluish color to your lips or fingernails.  You are lightheaded, dizzy, or faint.  Your peak flow is less than 50% of your personal best.  You have a fever or persistent symptoms for more than 2 3 days.  You have a fever and symptoms suddenly get worse. MAKE SURE YOU:   Understand these  instructions.  Will watch your condition.  Will get help right away if you are not doing well or get worse. Document Released: 03/26/2005 Document Revised: 11/26/2012 Document Reviewed: 10/23/2012 [facility] Patient Information 2014 [place], [place]. Asthma Attack Prevention Although there is no way to prevent asthma from starting, you can take steps to control the disease and reduce its symptoms. Learn about your asthma and how to control it. Take an active role to control your asthma by working with your health care provider to create and follow an asthma action plan. An asthma action plan guides you in:  Taking your medicines properly.  Avoiding things that set off your asthma or make your asthma worse (asthma triggers).  Tracking your level of asthma control.  Responding to worsening asthma.  Seeking emergency care when needed. To track your asthma, keep records of your symptoms, check your peak flow number using a handheld device that shows how well air moves out of your lungs (peak flow meter), and get regular asthma checkups.  WHAT ARE SOME WAYS TO PREVENT AN ASTHMA ATTACK?  Take medicines as directed by your health care provider.  Keep track of your asthma symptoms  and level of control.  With your health care provider, write a detailed plan for taking medicines and managing an asthma attack. Then be sure to follow your action plan. Asthma is an ongoing condition that needs regular monitoring and treatment.  Identify and avoid asthma triggers. Many outdoor allergens and irritants (such as pollen, mold, cold air, and air pollution) can trigger asthma attacks. Find out what your asthma triggers are and take steps to avoid them.  Monitor your breathing. Learn to recognize warning signs of an attack, such as coughing, wheezing, or shortness of breath. Your lung function may decrease before you notice any signs or symptoms, so regularly measure and record your peak airflow with a home  peak flow meter.  Identify and treat attacks early. If you act quickly, you are less likely to have a severe attack. You will also need less medicine to control your symptoms. When your peak flow measurements decrease and alert you to an upcoming attack, take your medicine as instructed and immediately stop any activity that may have triggered the attack. If your symptoms do not improve, get medical help.  Pay attention to increasing quick-relief inhaler use. If you find yourself relying on your quick-relief inhaler, your asthma is not under control. See your health care provider about adjusting your treatment. WHAT CAN MAKE MY SYMPTOMS WORSE? A number of common things can set off or make your asthma symptoms worse and cause temporary increased inflammation of your airways. Keep track of your asthma symptoms for several weeks, detailing all the environmental and emotional factors that are linked with your asthma. When you have an asthma attack, go back to your asthma diary to see which factor, or combination of factors, might have contributed to it. Once you know what these factors are, you can take steps to control many of them. If you have allergies and asthma, it is important to take asthma prevention steps at home. Minimizing contact with the substance to which you are allergic will help prevent an asthma attack. Some triggers and ways to avoid these triggers are: Animal Dander:  Some people are allergic to the flakes of skin or dried saliva from animals with fur or feathers.   There is no such thing as a hypoallergenic dog or cat breed. All dogs or cats can cause allergies, even if they don't shed.  Keep these pets out of your home.  If you are not able to keep a pet outdoors, keep the pet out of your bedroom and other sleeping areas at all times, and keep the door closed.  Remove carpets and furniture covered with cloth from your home. If that is not possible, keep the pet away from  fabric-covered furniture and carpets. Dust Mites: Many people with asthma are allergic to dust mites. Dust mites are tiny bugs that are found in every home in mattresses, pillows, carpets, fabric-covered furniture, bedcovers, clothes, stuffed toys, and other fabric-covered items.   Cover your mattress in a special dust-proof cover.  Cover your pillow in a special dust-proof cover, or wash the pillow each week in hot water. Water must be hotter than 130 F (54.4 C) to kill dust mites. Cold or warm water used with detergent and bleach can also be effective.  Wash the sheets and blankets on your bed each week in hot water.  Try not to sleep or lie on cloth-covered cushions.  Call ahead when traveling and ask for a smoke-free hotel room. Bring your own bedding and pillows in  case the hotel only supplies feather pillows and down comforters, which may contain dust mites and cause asthma symptoms.  Remove carpets from your bedroom and those laid on concrete, if you can.  Keep stuffed toys out of the bed, or wash the toys weekly in hot water or cooler water with detergent and bleach. Cockroaches: Many people with asthma are allergic to the droppings and remains of cockroaches.   Keep food and garbage in closed containers. Never leave food out.  Use poison baits, traps, powders, gels, or paste (for example, boric acid).  If a spray is used to kill cockroaches, stay out of the room until the odor goes away. Indoor Mold:  Fix leaky faucets, pipes, or other sources of water that have mold around them.  Clean floors and moldy surfaces with a fungicide or diluted bleach.  Avoid using humidifiers, vaporizers, or swamp coolers. These can spread molds through the air. Pollen and Outdoor Mold:  When pollen or mold spore counts are high, try to keep your windows closed.  Stay indoors with windows closed from late morning to afternoon. Pollen and some mold spore counts are highest at that  time.  Ask your health care provider whether you need to take anti-inflammatory medicine or increase your dose of the medicine before your allergy season starts. Other Irritants to Avoid:  Tobacco smoke is an irritant. If you smoke, ask your health care provider how you can quit. Ask family members to quit smoking too. Do not allow smoking in your home or car.  If possible, do not use a wood-burning stove, kerosene heater, or fireplace. Minimize exposure to all sources of smoke, including to incense, candles, fires, and fireworks.  Try to stay away from strong odors and sprays, such as perfume, talcum powder, hair spray, and paints.  Decrease humidity in your home and use an indoor air cleaning device. Reduce indoor humidity to below 60%. Dehumidifiers or central air conditioners can do this.  Decrease house dust exposure by changing furnace and air cooler filters frequently.  Try to have someone else vacuum for you once or twice a week. Stay out of rooms while they are being vacuumed and for a short while afterward.  If you vacuum, use a dust mask from a hardware store, a double-layered or microfilter vacuum cleaner bag, or a vacuum cleaner with a HEPA filter.  Sulfites in foods and beverages can be irritants. Do not drink beer or wine or eat dried fruit, processed potatoes, or shrimp if they cause asthma symptoms.  Cold air can trigger an asthma attack. Cover your nose and mouth with a scarf on cold or windy days.  Several health conditions can make asthma more difficult to manage, including a runny nose, sinus infections, reflux disease, psychological stress, and sleep apnea. Work with your health care provider to manage these conditions.  Avoid close contact with people who have a respiratory infection such as a cold or the flu, since your asthma symptoms may get worse if you catch the infection. Wash your hands thoroughly after touching items that may have been handled by people with a  respiratory infection.  Get a flu shot every year to protect against the flu virus, which often makes asthma worse for days or weeks. Also get a pneumonia shot if you have not previously had one. Unlike the flu shot, the pneumonia shot does not need to be given yearly. Medicines:  Talk to your health care provider about whether it is safe for  you to take aspirin or non-steroidal anti-inflammatory medicines (NSAIDs). In a small number of people with asthma, aspirin and NSAIDs can cause asthma attacks. These medicines must be avoided by people who have known aspirin-sensitive asthma. It is important that people with aspirin-sensitive asthma read labels of all over-the-counter medicines used to treat pain, colds, coughs, and fever.  Beta blockers and ACE inhibitors are other medicines you should discuss with your health care provider. HOW CAN I FIND OUT WHAT I AM ALLERGIC TO? Ask your asthma health care provider about allergy skin testing or blood testing (the RAST test) to identify the allergens to which you are sensitive. If you are found to have allergies, the most important thing to do is to try to avoid exposure to any allergens that you are sensitive to as much as possible. Other treatments for allergies, such as medicines and allergy shots (immunotherapy) are available.  CAN I EXERCISE? Follow your health care provider's advice regarding asthma treatment before exercising. It is important to maintain a regular exercise program, but vigorous exercise, or exercise in cold, humid, or dry environments can cause asthma attacks, especially for those people who have exercise-induced asthma. Document Released: 03/14/2009 Document Revised: 11/26/2012 Document Reviewed: 10/01/2012 [facility] Patient Information 2014 [place], [place]. Allergic Rhinitis Allergic rhinitis is when the mucous membranes in the nose respond to allergens. Allergens are particles in the air that cause your body to have an allergic  reaction. This causes you to release allergic antibodies. Through a chain of events, these eventually cause you to release histamine into the blood stream (hence the use of antihistamines). Although meant to be protective to the body, it is this release that causes your discomfort, such as frequent sneezing, congestion and an itchy runny nose.  CAUSES  The pollen allergens may come from grasses, trees, and weeds. This is seasonal allergic rhinitis, or "hay fever." Other allergens cause year-round allergic rhinitis (perennial allergic rhinitis) such as house dust mite allergen, pet dander and mold spores.  SYMPTOMS   Nasal stuffiness (congestion).  Runny, itchy nose with sneezing and tearing of the eyes.  There is often an itching of the mouth, eyes and ears. It cannot be cured, but it can be controlled with medications. DIAGNOSIS  If you are unable to determine the offending allergen, skin or blood testing may find it. TREATMENT   Avoid the allergen.  Medications and allergy shots (immunotherapy) can help.  Hay fever may often be treated with antihistamines in pill or nasal spray forms. Antihistamines block the effects of histamine. There are over-the-counter medicines that may help with nasal congestion and swelling around the eyes. Check with your caregiver before taking or giving this medicine. If the treatment above does not work, there are many new medications your caregiver can prescribe. Stronger medications may be used if initial measures are ineffective. Desensitizing injections can be used if medications and avoidance fails. Desensitization is when a patient is given ongoing shots until the body becomes less sensitive to the allergen. Make sure you follow up with your caregiver if problems continue. SEEK MEDICAL CARE IF:   You develop fever (more than 100.5 F (38.1 C).  You develop a cough that does not stop easily (persistent).  You have shortness of breath.  You start  wheezing.  Symptoms interfere with normal daily activities. Document Released: 12/19/2000 Document Revised: 06/18/2011 Document Reviewed: 06/30/2008 [facility] Patient Information 2014 [place], [place]. Allergy Shots Frequently Asked Questions Allergy shots are a treatment used to help lessen allergy  symptoms such as:  Sneezing.  Itchy, watery eyes.  Runny, stuffy nose.  Asthma. WHAT MAY BE IN MY ALLERGY SHOT?  Grass, tree, and weed pollens.  Insects.  Animal dander (old skin scales which your animal is always shedding).  Dust mites.  Molds. HOW DO ALLERGY SHOTS HELP MY ALLERGY SYMPTOMS?  Allergy shots "turn down" your body's reaction to the things you are allergic to. HOW OFTEN DO I GET MY ALLERGY SHOT? Every week until you safely "build up" to your "maintenance dose." WHAT IS THE MAINTENANCE DOSE? It is the dose that gives you the most relief from your allergy symptoms. HOW LONG DOES IT TAKE TO GET TO A MAINTENANCE DOSE? If you keep all your appointments and you have no reactions, it may take 5 to 7 months. WHAT IF I MISS AN ALLERGY SHOT? Missing appointments makes it take longer for you to get to your maintenance dose. It is very important to keep all your appointments. If you miss one, make another appointment and tell the nurse at your next appointment. WHAT ARE SOME OF THE COMMON REACTIONS TO ALLERGY SHOTS? Most common reactions include redness and puffiness (swelling) where the shot was given. This reaction is mild and goes away on its own. Less common reactions are:  Itchy eyes, nose, or throat.  Sneezing, runny nose.  Red bumps (hives).  Trouble breathing.  Coughing.  Wheezing.  Scratchy throat.  Tightness in the chest. Caution: If you notice any reaction within 24 hours, report this to the allergy nurse before getting your next shot. WHY MUST I STAY AFTER I GET MY ALLERGY SHOT? For your safety, you must stay in the clinic for up to 30 minutes after  getting the allergy shot. Before you leave, the nurse will check for any reaction at the place where the shot was given. WHEN WILL MY ALLERGY SYMPTOMS GET BETTER? Allergy shots begin to work shortly after you begin treatment, but your allergy symptoms may not get better for 4 to 6 months. WHAT DO I DO IF I FEEL SICK ON THE DAY OF MY ALLERGY SHOT? Tell the nurse before you get your shot. WHEN MIGHT I STOP GETTING MY ALLERGY SHOTS?  Usually after you have been getting allergy shots for about 3 to 5 years.  If the shots do not work for you.  If you start taking a type of medicine called a beta blocker. (Beta blockers are commonly used for lowering blood pressure.)  If you miss many of the appointments for your shots.  If you do not follow the instructions given to you by your allergy clinic staff. Tell your doctor if you start any new medicines while you are getting allergy shots. GET HELP RIGHT AWAY IF:  You have trouble breathing.  You have red bumps on your skin within 24 hours after your shot. Document Released: 01/03/2008 Document Revised: 06/18/2011 Document Reviewed: 01/03/2008 [facility] Patient Information 2014 [place], [place].

## 2013-03-12 NOTE — Telephone Encounter (Signed)
Patient was seen today and says he was told that a rx for prednisone would be called in. It wasn't in his med list but he said he was told to get that one as well. Please send to [facility] on [address] like other rx's. Cb# [phone]

## 2013-03-12 NOTE — Telephone Encounter (Signed)
He had Depomedrol 120 injection advised him this is the steroid he needs not oral.

## 2013-03-12 NOTE — Progress Notes (Signed)
   Subjective:    Patient ID: Justin Bass, male    DOB: Dec 11, 1985, 27 y.o.   MRN: [id number]  HPI Getting worse, meds worked last visit, has cats and feather pillows. Hx also of sneezing attacks, congestion, itching. Using only albuterol.   Review of Systems     Objective:   Physical Exam  Vitals reviewed. Constitutional: He is oriented to person, place, and time. He appears well-developed and well-nourished.  HENT:  Head: Normocephalic.  Right Ear: External ear normal.  Left Ear: External ear normal.  Nose: Mucosal edema, rhinorrhea and sinus tenderness present.  Mouth/Throat: Oropharynx is clear and moist.  Eyes: Conjunctivae and EOM are normal. Pupils are equal, round, and reactive to light.  Cardiovascular: Normal rate, regular rhythm and normal heart sounds.   Pulmonary/Chest: No accessory muscle usage. Not tachypneic. No respiratory distress. He has decreased breath sounds. He has wheezes. He has no rhonchi. He has no rales.  Neurological: He is alert and oriented to person, place, and time. No cranial nerve deficit. He exhibits normal muscle tone. Coordination normal.  Skin: No rash noted.  Psychiatric: He has a normal mood and affect. His behavior is normal.     PFR 500, normal 600 l/min     Assessment & Plan:  Advair 250/50 bid/Fluticasone nasal 2 sprays ea qd Cetirizine 10mg  po qd RTC dec 20th see me Remove all feathers and cats from house

## 2013-03-13 ENCOUNTER — Telehealth: Payer: Self-pay

## 2013-03-13 DIAGNOSIS — J683 Other acute and subacute respiratory conditions due to chemicals, gases, fumes and vapors: Secondary | ICD-10-CM

## 2013-03-13 DIAGNOSIS — J45998 Other asthma: Secondary | ICD-10-CM

## 2013-03-13 MED ORDER — ALBUTEROL SULFATE HFA 108 (90 BASE) MCG/ACT IN AERS: 2.0000 | INHALATION_SPRAY | RESPIRATORY_TRACT | Status: DC | PRN

## 2013-03-13 NOTE — Telephone Encounter (Signed)
Patient says that some of his prescriptions are missing when he went to pick them up at the pharmacy

## 2013-03-13 NOTE — Telephone Encounter (Signed)
Called pt to find out which medication he is looking for.

## 2013-03-13 NOTE — Telephone Encounter (Signed)
Meds ordered this encounter  Medications  . albuterol (PROVENTIL HFA;VENTOLIN HFA) 108 (90 BASE) MCG/ACT inhaler    Sig: Inhale 2 puffs into the lungs every 4 (four) hours as needed for wheezing (cough, shortness of breath or wheezing.).    Dispense:  1 Inhaler    Refill:  1    Order Specific Question:  Supervising Provider    Answer:  DOOLITTLE, ROBERT P [[id number]]

## 2013-03-13 NOTE — Telephone Encounter (Signed)
INSURANCE COMPANY WON'T COVER MEDS PRESCRIBED   TRY [employer]  [phone]

## 2013-03-13 NOTE — Telephone Encounter (Signed)
Patient states that he was told he was getting a rescue inhaler along will the Advair. I do not see this in the notes or orders. I have resent the advair to his pharmacy. Do you advise we send it this medication also?

## 2013-03-13 NOTE — Telephone Encounter (Signed)
Patient is missing his inhaler.

## 2013-03-14 MED ORDER — BUDESONIDE-FORMOTEROL FUMARATE 160-4.5 MCG/ACT IN AERO: 2.0000 | INHALATION_SPRAY | Freq: Two times a day (BID) | RESPIRATORY_TRACT | Status: DC

## 2013-03-14 NOTE — Telephone Encounter (Signed)
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 albuterol (PROVENTIL HFA;VENTOLIN HFA) 108 (90 BASE) MCG/ACT inhaler    Sig: Inhale 2 puffs into the lungs every 4 (four) hours as needed for wheezing (cough, shortness of breath or wheezing.).    Dispense:  1 Inhaler    Refill:  1    Order Specific Question:  Supervising Provider    Answer:  DOOLITTLE, ROBERT P [[id number]]  . budesonide-formoterol (SYMBICORT) 160-4.5 MCG/ACT inhaler    Sig: Inhale 2 puffs into the lungs 2 (two) times daily.    Dispense:  1 Inhaler    Refill:  3    Order Specific Question:  Supervising Provider    Answer:  DOOLITTLE, ROBERT P [[id number]]

## 2013-06-24 ENCOUNTER — Ambulatory Visit: Payer: Managed Care, Other (non HMO) | Admitting: Physician Assistant

## 2013-06-24 VITALS — BP 122/72 | HR 61 | Temp 98.0°F | Resp 17 | Ht 67.5 in | Wt 160.0 lb

## 2013-06-24 DIAGNOSIS — J40 Bronchitis, not specified as acute or chronic: Secondary | ICD-10-CM

## 2013-06-24 DIAGNOSIS — R059 Cough, unspecified: Secondary | ICD-10-CM

## 2013-06-24 DIAGNOSIS — R0981 Nasal congestion: Secondary | ICD-10-CM

## 2013-06-24 DIAGNOSIS — R05 Cough: Secondary | ICD-10-CM

## 2013-06-24 LAB — POCT INFLUENZA A/B
INFLUENZA A, POC: NEGATIVE
Influenza B, POC: NEGATIVE

## 2013-06-24 MED ORDER — HYDROCOD POLST-CHLORPHEN POLST 10-8 MG/5ML PO LQCR: 5.0000 mL | Freq: Two times a day (BID) | ORAL | Status: DC | PRN

## 2013-06-24 MED ORDER — IPRATROPIUM BROMIDE 0.06 % NA SOLN: 2.0000 | Freq: Three times a day (TID) | NASAL | Status: DC

## 2013-06-24 MED ORDER — AZITHROMYCIN 250 MG PO TABS: ORAL_TABLET | ORAL | Status: DC

## 2013-06-24 MED ORDER — PREDNISONE 20 MG PO TABS: ORAL_TABLET | ORAL | Status: DC

## 2013-06-24 NOTE — Progress Notes (Signed)
Subjective:    Patient ID: Justin Bass, male    DOB: 1985-09-07, 28 y.o.   MRN: [id number]  HPI Primary Physician: Norberto Sorenson, MD  Chief Complaint: URI x 4-5 days  HPI: 28 y.o. male with history below presents with 4-5 day history of cough, SOB, sinus pressure, nasal congestion, rhinorrhea, headache, muffled hearing, and fatigue. Afebrile. No chills. Cough is productive of green sputum and worse in the morning and at nighttime. Some SOB and wheezing. These are focused around the mornings and nighttime as well. He is asymptomatic during the daytime. 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 Because of this there is always some breathing issue. He does not use his albuterol inhaler at baseline however and declines maintenance therapy.   Has been having to use his albuterol inhaler once every other day since illness began.    Past Medical History  Diagnosis Date  . Celiac disease   . Celiac disease   . Allergy      Home Meds: Prior to Admission medications   Medication Sig Start Date End Date Taking? Authorizing Provider  albuterol (PROVENTIL HFA;VENTOLIN HFA) 108 (90 BASE) MCG/ACT inhaler Inhale 2 puffs into the lungs every 4 (four) hours as needed for wheezing (cough, shortness of breath or wheezing.). 03/13/13  Yes Chelle S Jeffery, PA-C  valACYclovir (VALTREX) 500 MG tablet Take 1 tablet (500 mg total) by mouth daily. 02/06/13  Yes Sherren Mocha, MD    Allergies: No Known Allergies  History   Social History  . Marital Status: Married    Spouse Name: N/A    Number of Children: N/A  . Years of Education: N/A   Occupational History  . Not on file.   Social History Main Topics  . Smoking status: Former Games developer  . Smokeless tobacco: Former Neurosurgeon    Quit date: 06/08/2011  . Alcohol Use: No  . Drug Use: No  . Sexual Activity: Not on file   Other Topics Concern  . Not on file   Social History Narrative  . No narrative on file      Review of Systems  Constitutional: Positive for appetite change and fatigue. Negative for fever and chills.       Pushing fluids .  HENT: Positive for congestion, hearing loss, postnasal drip, rhinorrhea, sinus pressure and sore throat. Negative for ear pain and sneezing.        Nasal congestion thorough out the day.  ST in the AM, but no tduring hte day  Respiratory: Positive for cough, shortness of breath and wheezing.        Cough is productive of green sputum. Cough is early in the morning and at night.   Gastrointestinal: Negative for nausea, vomiting and diarrhea.  Musculoskeletal: Negative for myalgias.  Neurological: Positive for headaches.       Stuffy .       Objective:   Physical Exam  Physical Exam: Blood pressure 122/72, pulse 61, temperature 98 F (36.7 C), temperature source Oral, resp. rate 17, height 5' 7.5" (1.715 m), weight 160 lb (72.576 kg), SpO2 99.00%., Body mass index is 24.68 kg/(m^2). General: Well developed, well nourished, in no acute distress. Head: Normocephalic, atraumatic, eyes without discharge, sclera non-icteric, nares are congested. Bilateral auditory canals clear, TM's are without perforation, pearly grey with reflective cone of light bilaterally. No sinus TTP. Oral cavity moist, dentition normal. Posterior pharynx with post nasal drip and mild erythema. No peritonsillar  abscess or tonsillar exudate. Uvula midline.  Neck: Supple. No thyromegaly. Full ROM. No lymphadenopathy. No nuchal rigidity.  Lungs: Clear to auscultation bilaterally without wheezes, rales, or rhonchi. Breathing is unlabored.  Heart: RRR with S1 S2. No murmurs, rubs, or gallops appreciated. Msk:  Strength and tone normal for age. Extremities: No clubbing or cyanosis. No edema. Neuro: Alert and oriented X 3. Moves all extremities spontaneously. CNII-XII grossly in tact. Psych:  Responds to questions appropriately with a normal affect.   Labs: Results for orders placed in visit  on 06/24/13  POCT INFLUENZA A/B      Result Value Ref Range   Influenza A, POC Negative     Influenza B, POC Negative          Assessment & Plan:  28 year old male with bronchitis, cough, and nasal congestion -Azithromycin 250 MG #6 2 po first day then 1 po next 4 days no RF -Prednisone 20 mg #18 3x3, 2x3, 1x3 no RF -Tussionex 1 tsp po q 12 hours prn cough #90 mL no RF -Atrovent NS 0.06% 2 sprays each nare bid prn #1 no RF -Mucinex -He is moving air great on exam today. However, if his baseline issues discussed above persist RTC for further evaluation.  -RTC precautions    Eula Listenyan Lashuna Tamashiro, MHS, PA-C [facility] [address] [place], [place] [address] [phone] [facility] 06/24/2013 12:48 PM

## 2013-07-08 ENCOUNTER — Other Ambulatory Visit: Payer: Self-pay | Admitting: Chiropractic Medicine

## 2013-07-08 ENCOUNTER — Ambulatory Visit
Admission: RE | Admit: 2013-07-08 | Discharge: 2013-07-08 | Disposition: A | Discharge status: Home / Self Care | Payer: Managed Care, Other (non HMO) | Source: Ambulatory Visit | Attending: Chiropractic Medicine | Admitting: Chiropractic Medicine

## 2013-07-08 DIAGNOSIS — R52 Pain, unspecified: Secondary | ICD-10-CM

## 2013-07-08 IMAGING — CR DG FOOT COMPLETE 3+V*L*
3 series · 3 of 3 positions shown · non-contrast
Comparison: None.

CLINICAL DATA: Lateral left foot pain, no known injury

EXAM:
LEFT FOOT - COMPLETE 3+ VIEW

[view not recorded (1 of 3)]
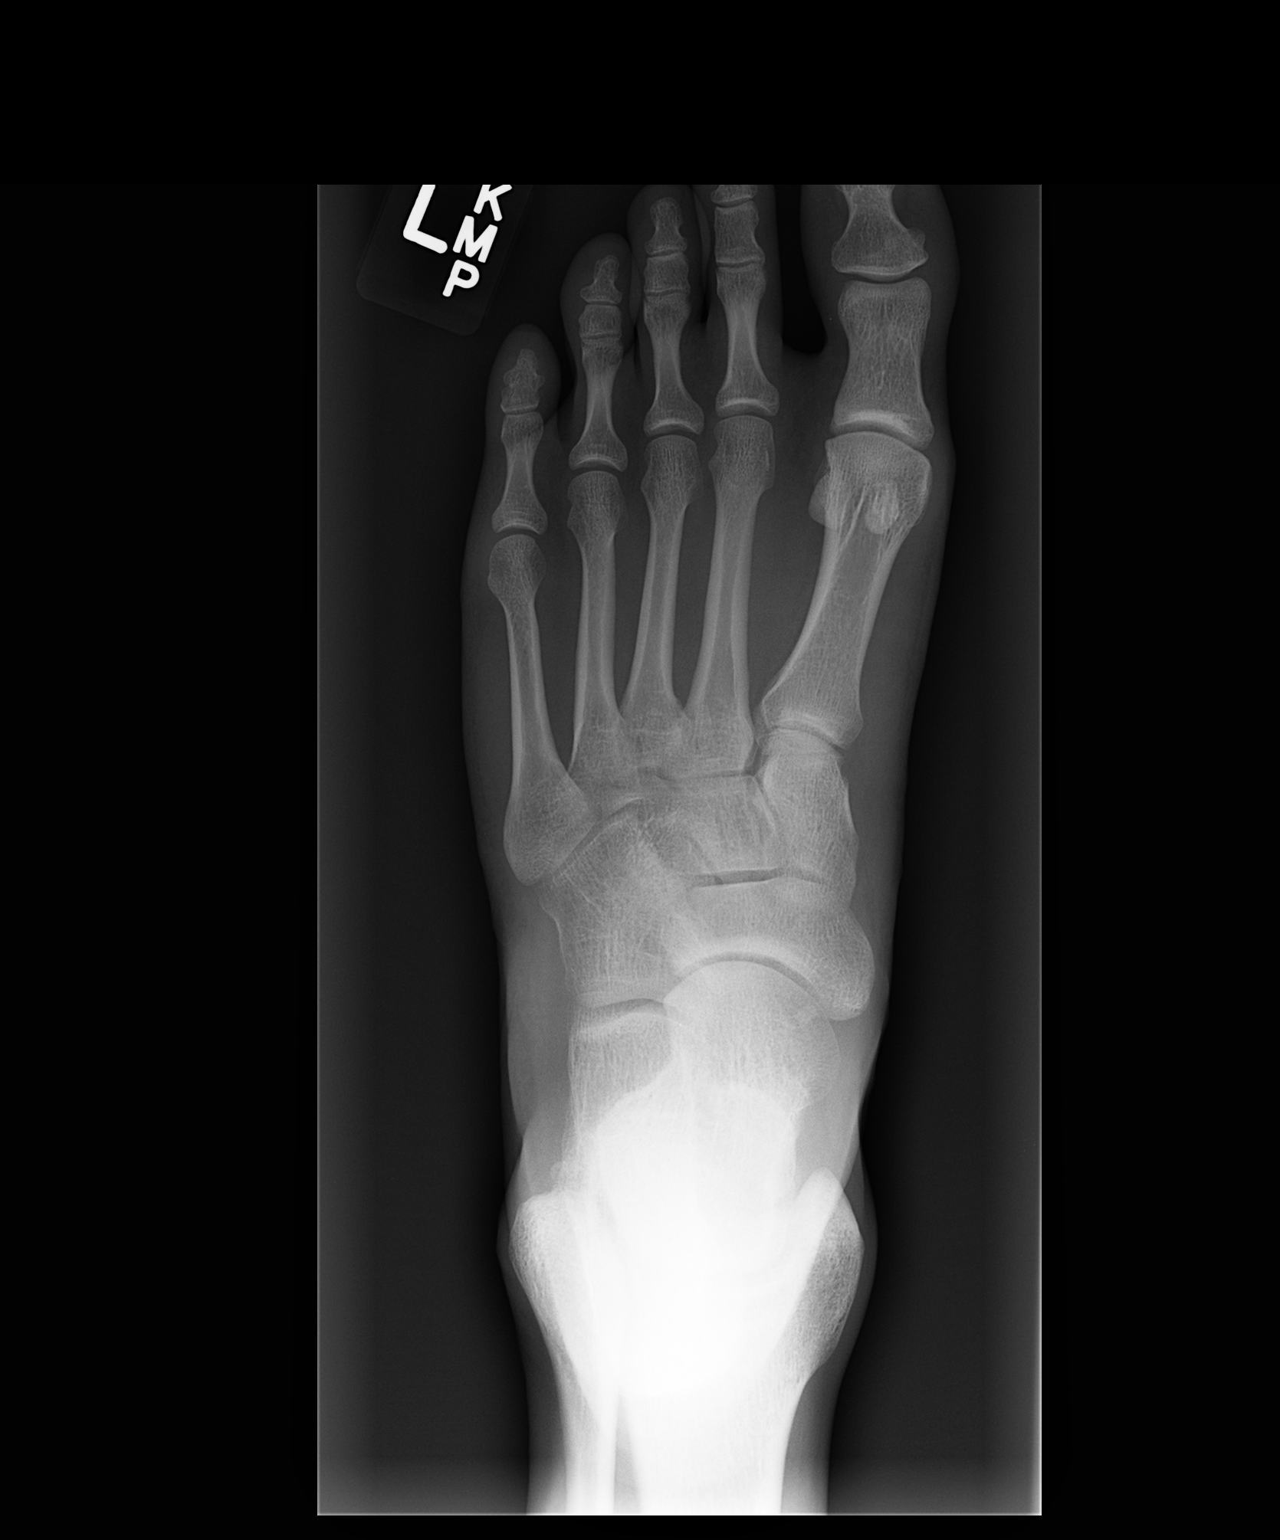

[view not recorded (2 of 3)]
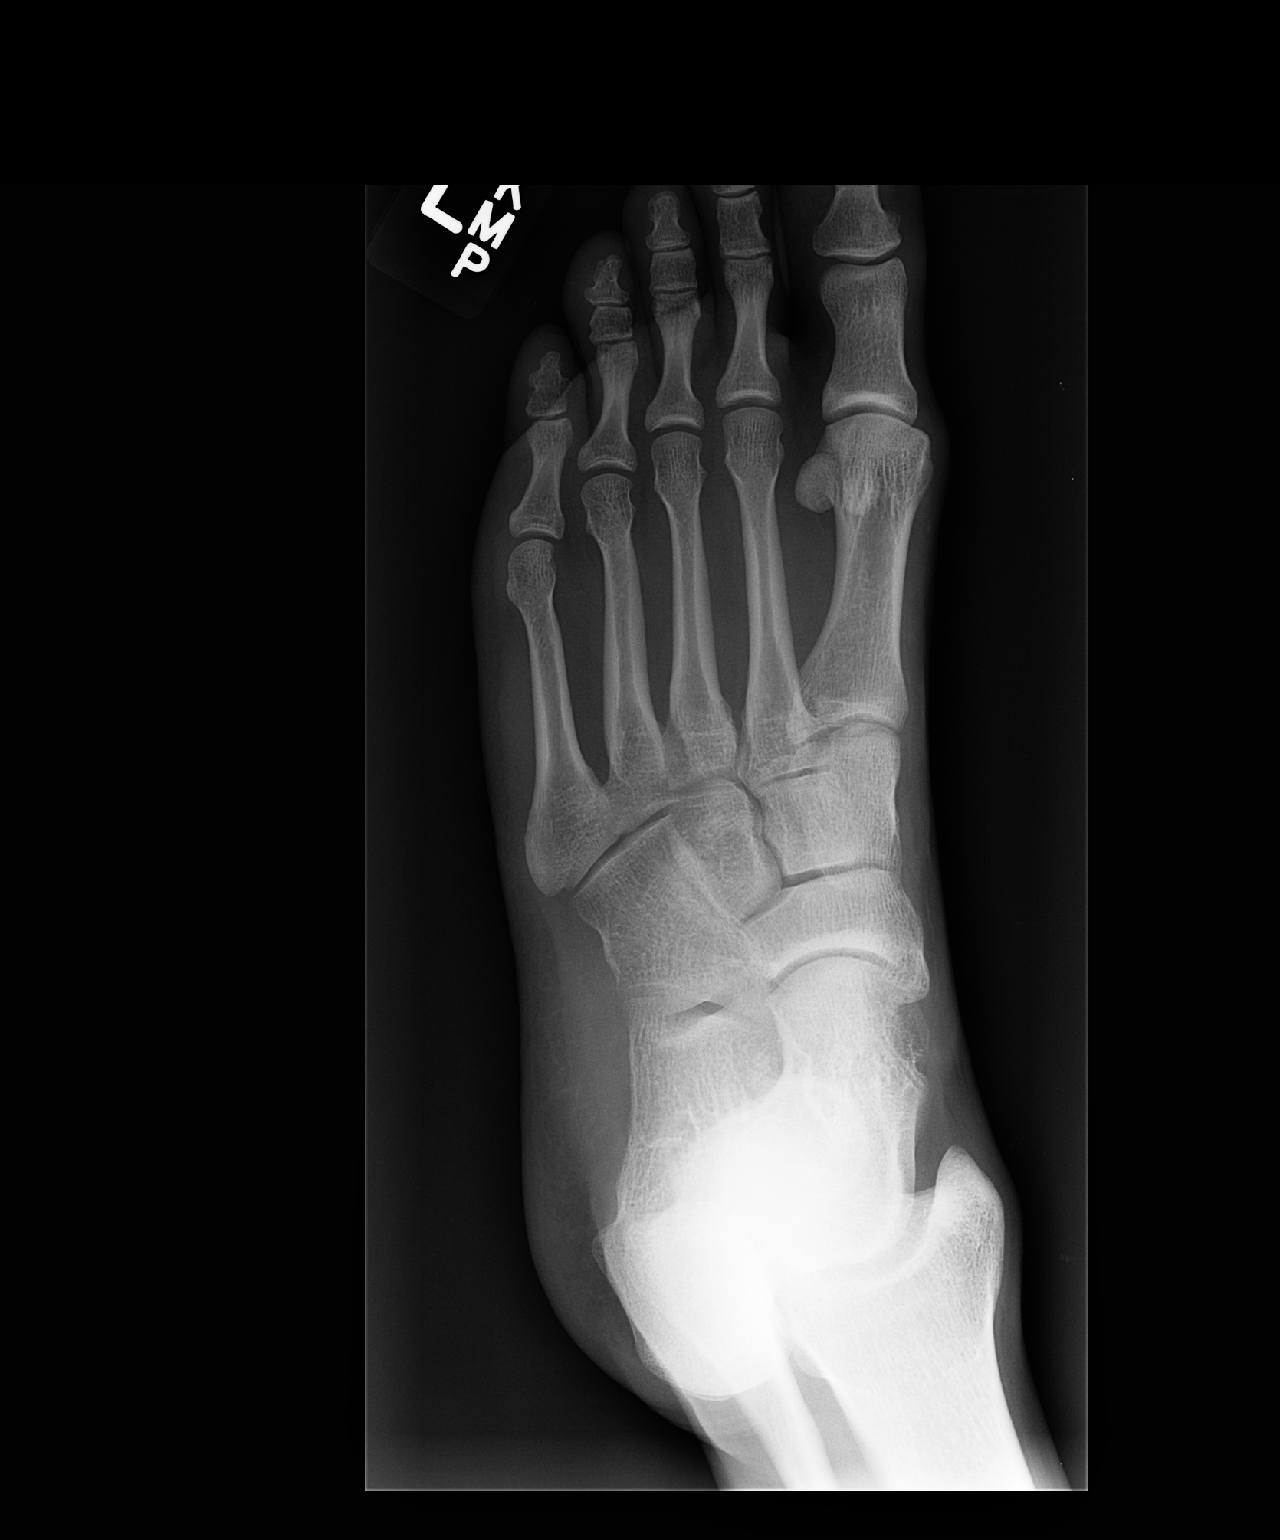

[view not recorded (3 of 3)]
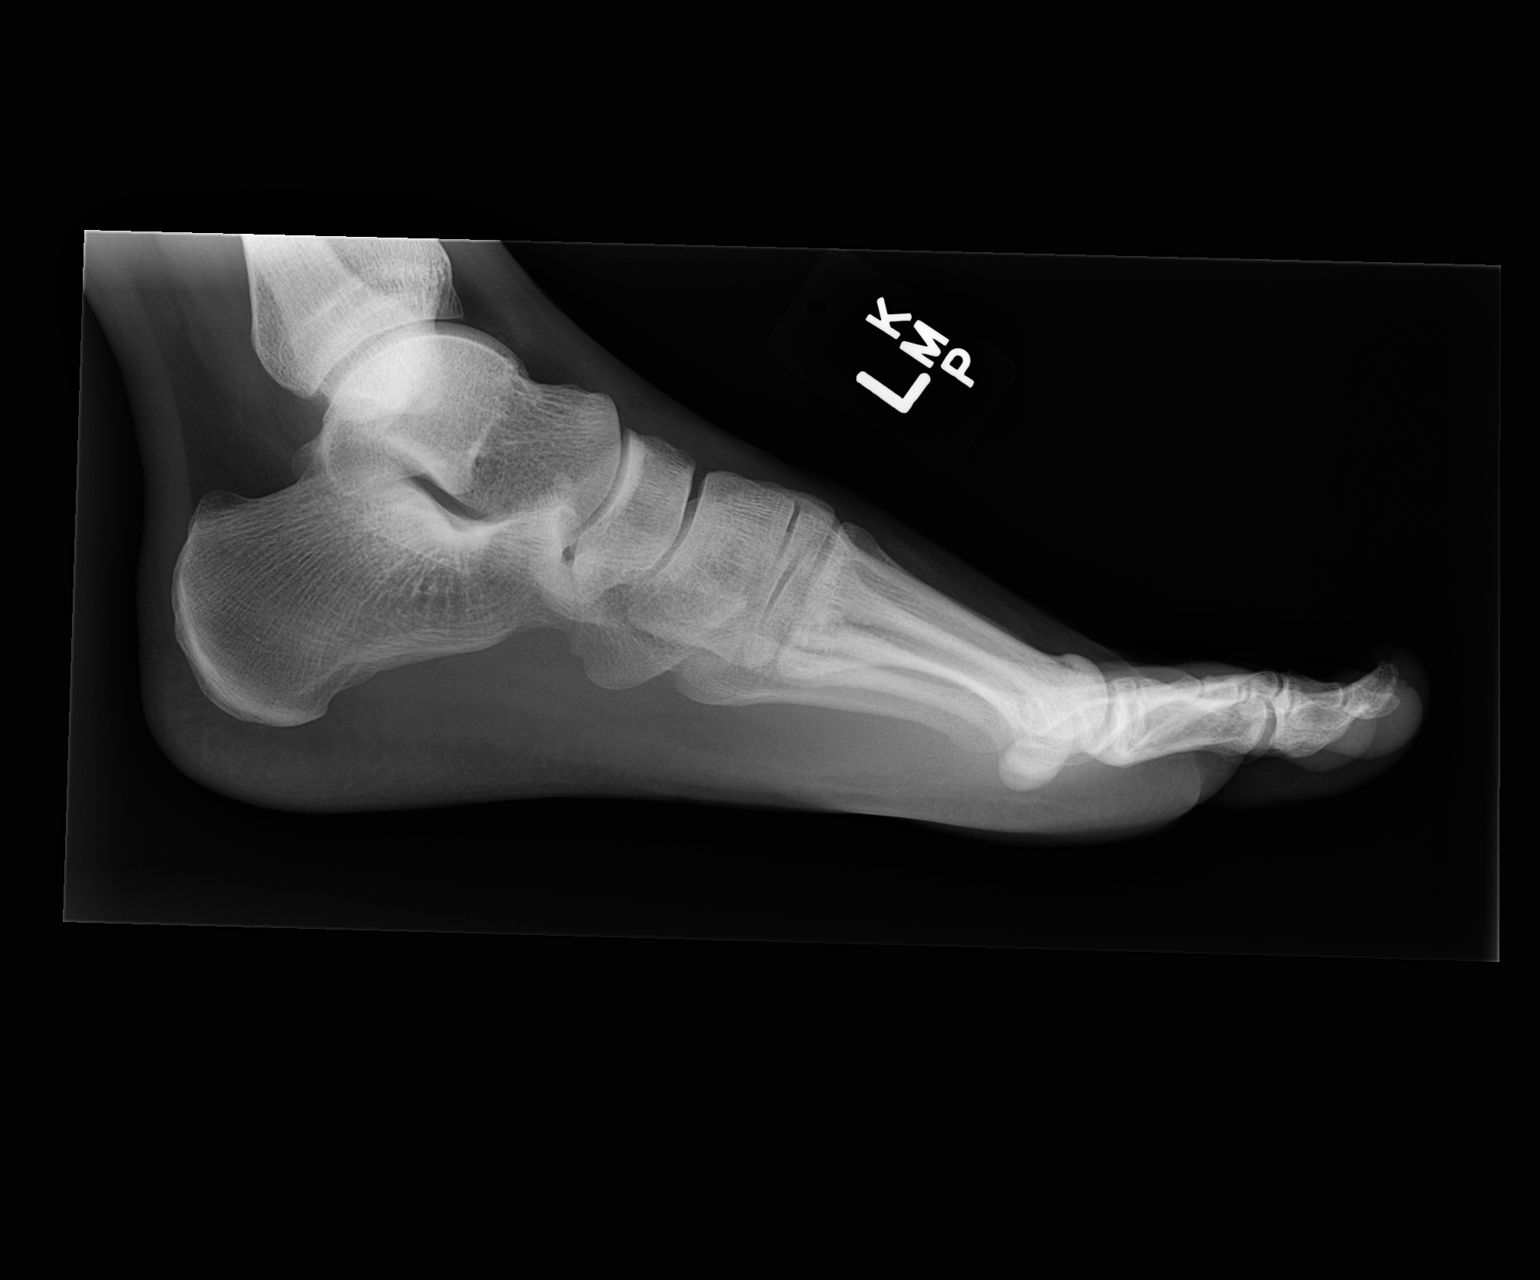

[3 of 3 positions shown; findings below may reference images not displayed]

FINDINGS: Tarsal-metatarsal alignment is normal. No fracture is seen. Joint
spaces appear normal.
IMPRESSION: Negative.

## 2013-07-15 ENCOUNTER — Ambulatory Visit: Payer: Managed Care, Other (non HMO) | Admitting: Family Medicine

## 2013-07-15 VITALS — BP 130/80 | HR 66 | Temp 97.5°F | Resp 16 | Ht 68.5 in | Wt 163.0 lb

## 2013-07-15 DIAGNOSIS — M779 Enthesopathy, unspecified: Secondary | ICD-10-CM

## 2013-07-15 DIAGNOSIS — M25579 Pain in unspecified ankle and joints of unspecified foot: Secondary | ICD-10-CM

## 2013-07-15 DIAGNOSIS — M25529 Pain in unspecified elbow: Secondary | ICD-10-CM

## 2013-07-15 MED ORDER — LIDOCAINE 5 % EX PTCH: 1.0000 | MEDICATED_PATCH | CUTANEOUS | Status: DC

## 2013-07-15 MED ORDER — NITROGLYCERIN 0.2 MG/HR TD PT24: MEDICATED_PATCH | TRANSDERMAL | Status: DC

## 2013-07-15 NOTE — Progress Notes (Signed)
Chief Complaint:  Chief Complaint  Patient presents with  . Tendonitis    x 6 weeks    HPI: Justin Bass is a 28 y.o. male who is here for: 1. Left elbow pain for the last 6-8 weeks, medial aspect only. He does a lot of lifting at work but he also has been training for cross fit games and has been doing more work. He has had no numbness or tingling. He has it in his left foot as well He has tried acupuncture, chiropractic work. He has tried ibuprofen but can only do that in small amoutns due to celiac disease. He has to avoid tylenol due to celiac disease. No prior injuries. HE is from [place].   Past Medical History  Diagnosis Date  . Celiac disease   . Celiac disease   . Allergy    History reviewed. No pertinent past surgical history. History   Social History  . Marital Status: Married    Spouse Name: N/A    Number of Children: N/A  . Years of Education: N/A   Social History Main Topics  . Smoking status: Former Games developer  . Smokeless tobacco: Former Neurosurgeon    Quit date: 06/08/2011  . Alcohol Use: No  . Drug Use: No  . Sexual Activity: None   Other Topics Concern  . None   Social History Narrative  . None   History reviewed. No pertinent family history. No Known Allergies Prior to Admission medications   Medication Sig Start Date End Date Taking? Authorizing Provider  albuterol (PROVENTIL HFA;VENTOLIN HFA) 108 (90 BASE) MCG/ACT inhaler Inhale 2 puffs into the lungs every 4 (four) hours as needed for wheezing (cough, shortness of breath or wheezing.). 03/13/13  Yes Chelle S Jeffery, PA-C  chlorpheniramine-HYDROcodone (TUSSIONEX PENNKINETIC ER) 10-8 MG/5ML LQCR Take 5 mLs by mouth every 12 (twelve) hours as needed for cough. 06/24/13  Yes Ryan M Dunn, PA-C  valACYclovir (VALTREX) 500 MG tablet Take 1 tablet (500 mg total) by mouth daily. 02/06/13  Yes Sherren Mocha, MD  ipratropium (ATROVENT) 0.06 % nasal spray Place 2 sprays into the nose 3 (three) times daily. 06/24/13    Sondra Barges, PA-C     ROS: The patient denies fevers, chills, night sweats, unintentional weight loss, chest pain, palpitations, wheezing, dyspnea on exertion, nausea, vomiting, abdominal pain, dysuria, hematuria, melena, numbness, weakness, or tingling.   All other systems have been reviewed and were otherwise negative with the exception of those mentioned in the HPI and as above.    PHYSICAL EXAM: Filed Vitals:   07/15/13 0902  BP: 130/80  Pulse: 66  Temp: 97.5 F (36.4 C)  Resp: 16   Filed Vitals:   07/15/13 0902  Height: 5' 8.5" (1.74 m)  Weight: 163 lb (73.936 kg)   Body mass index is 24.42 kg/(m^2).  General: Alert, no acute distress HEENT:  Normocephalic, atraumatic, oropharynx patent. EOMI, PERRLA Cardiovascular:  Regular rate and rhythm, no rubs murmurs or gallops.  No Carotid bruits, radial pulse intact. No pedal edema.  Respiratory: Clear to auscultation bilaterally.  No wheezes, rales, or rhonchi.  No cyanosis, no use of accessory musculature GI: No organomegaly, abdomen is soft and non-tender, positive bowel sounds.  No masses. Skin: No rashes. Neurologic: Facial musculature symmetric. Psychiatric: Patient is appropriate throughout our interaction. Lymphatic: No cervical lymphadenopathy Musculoskeletal: Gait intact. Left pt tenderness at medial epicondyle, no warm or erythema Left foot tenderness lateral navicular and bottom of foot  5/5 strength, no crepitus   LABS: Results for orders placed in visit on 06/24/13  POCT INFLUENZA A/B      Result Value Ref Range   Influenza A, POC Negative     Influenza B, POC Negative       EKG/XRAY:   Primary [facility]   ASSESSMENT/PLAN: Encounter Diagnoses  Name Primary?  . Elbow pain Yes  . Pain in joint, ankle and foot   . Tendonitis    Will try a trial of nitroglycerin patch and also lidoderm patch prn He can't take certain meds due to celiac disease Will f/u prn  Gross  sideeffects, risk and benefits, and alternatives of medications d/w patient. Patient is aware that all medications have potential sideeffects and we are unable to predict every sideeffect or drug-drug interaction that may occur.  Lenell Antuhao P Aj Crunkleton, DO 07/15/2013 9:43 AM

## 2013-07-17 ENCOUNTER — Telehealth: Payer: Self-pay

## 2013-07-17 DIAGNOSIS — M779 Enthesopathy, unspecified: Secondary | ICD-10-CM

## 2013-07-17 MED ORDER — DICLOFENAC SODIUM 1 % TD GEL: 2.0000 g | Freq: Four times a day (QID) | TRANSDERMAL | Status: DC | PRN

## 2013-07-17 NOTE — Telephone Encounter (Signed)
Pt called to check status of PA on lidocaine patches. Called [employer] and completed on phone to hurry PA for pt. It went to review by pharmacist and was then denied because it is not covered for pt's Dxs even though he can not take NSAIDS d/t celiac disease. Is there anything else we can try for pt? I'm sending to PA pool as well as Dr Conley RollsLe since she is not due back in office this weekend.  Notified pt of status on VM and that I have sent message to providers.

## 2013-07-17 NOTE — Telephone Encounter (Signed)
Lets try voltaren gel, I have ordered it. Let pt know not to take ibuprofen with this. He already knows I recommend modifying acitivities but he is trying to just push through till the games finish. PLease call him to let him know about voltaren gel. No other NSAIDs with this since similar but in topical form.

## 2013-07-18 ENCOUNTER — Telehealth: Payer: Self-pay

## 2013-07-18 NOTE — Telephone Encounter (Signed)
Lm on pt voicemail about medication. Adhere to warnings against NSAID use. Rtn call if any further questions.

## 2013-07-22 NOTE — Telephone Encounter (Signed)
PA needed for Voltaren gel. Completed on the phone, but coverage was denied for this also. Notified pharm of denial. Dr Conley RollsLe, [place] Child[place].

## 2013-07-28 NOTE — Telephone Encounter (Signed)
LM that all prior authorizations for the meds I have ordered have been denied in terms of patches, ie flector/voltaren gel , ie lidoderm Gave him 3 options , he can use hydrocodone, tramadol or steroid pack or he can see a specialist and they can do a steroid injection prn.  He can let me know what he wants to do.

## 2013-10-09 ENCOUNTER — Ambulatory Visit (INDEPENDENT_AMBULATORY_CARE_PROVIDER_SITE_OTHER): Payer: Self-pay

## 2013-10-09 ENCOUNTER — Ambulatory Visit (INDEPENDENT_AMBULATORY_CARE_PROVIDER_SITE_OTHER): Payer: Self-pay | Admitting: Internal Medicine

## 2013-10-09 VITALS — BP 124/72 | HR 54 | Temp 98.0°F | Resp 18 | Ht 67.5 in | Wt 162.2 lb

## 2013-10-09 DIAGNOSIS — M25539 Pain in unspecified wrist: Secondary | ICD-10-CM

## 2013-10-09 DIAGNOSIS — M25531 Pain in right wrist: Secondary | ICD-10-CM

## 2013-10-09 IMAGING — CR DG WRIST COMPLETE 3+V*L*
2 series · 2 of 2 positions shown · non-contrast
Comparison: None.

CLINICAL DATA: Left wrist pain after lifting weights.

EXAM:
LEFT WRIST - COMPLETE 3+ VIEW

[PA]
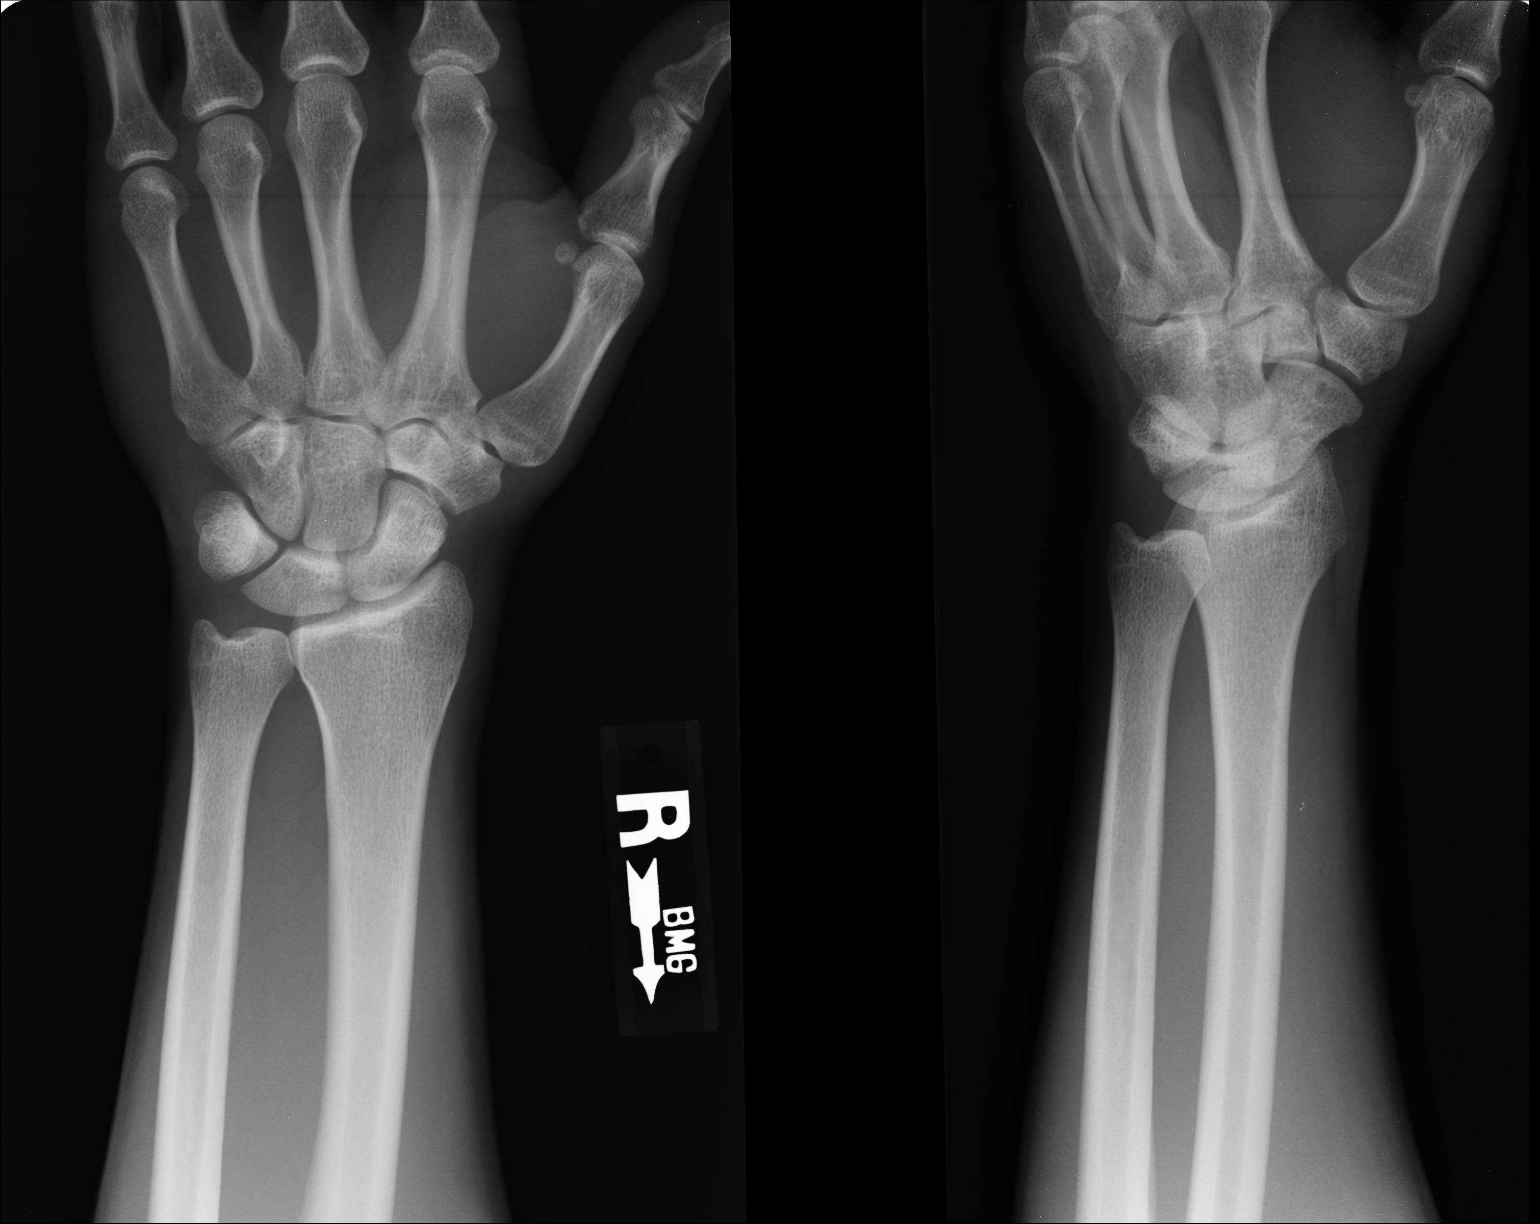

[lateral]
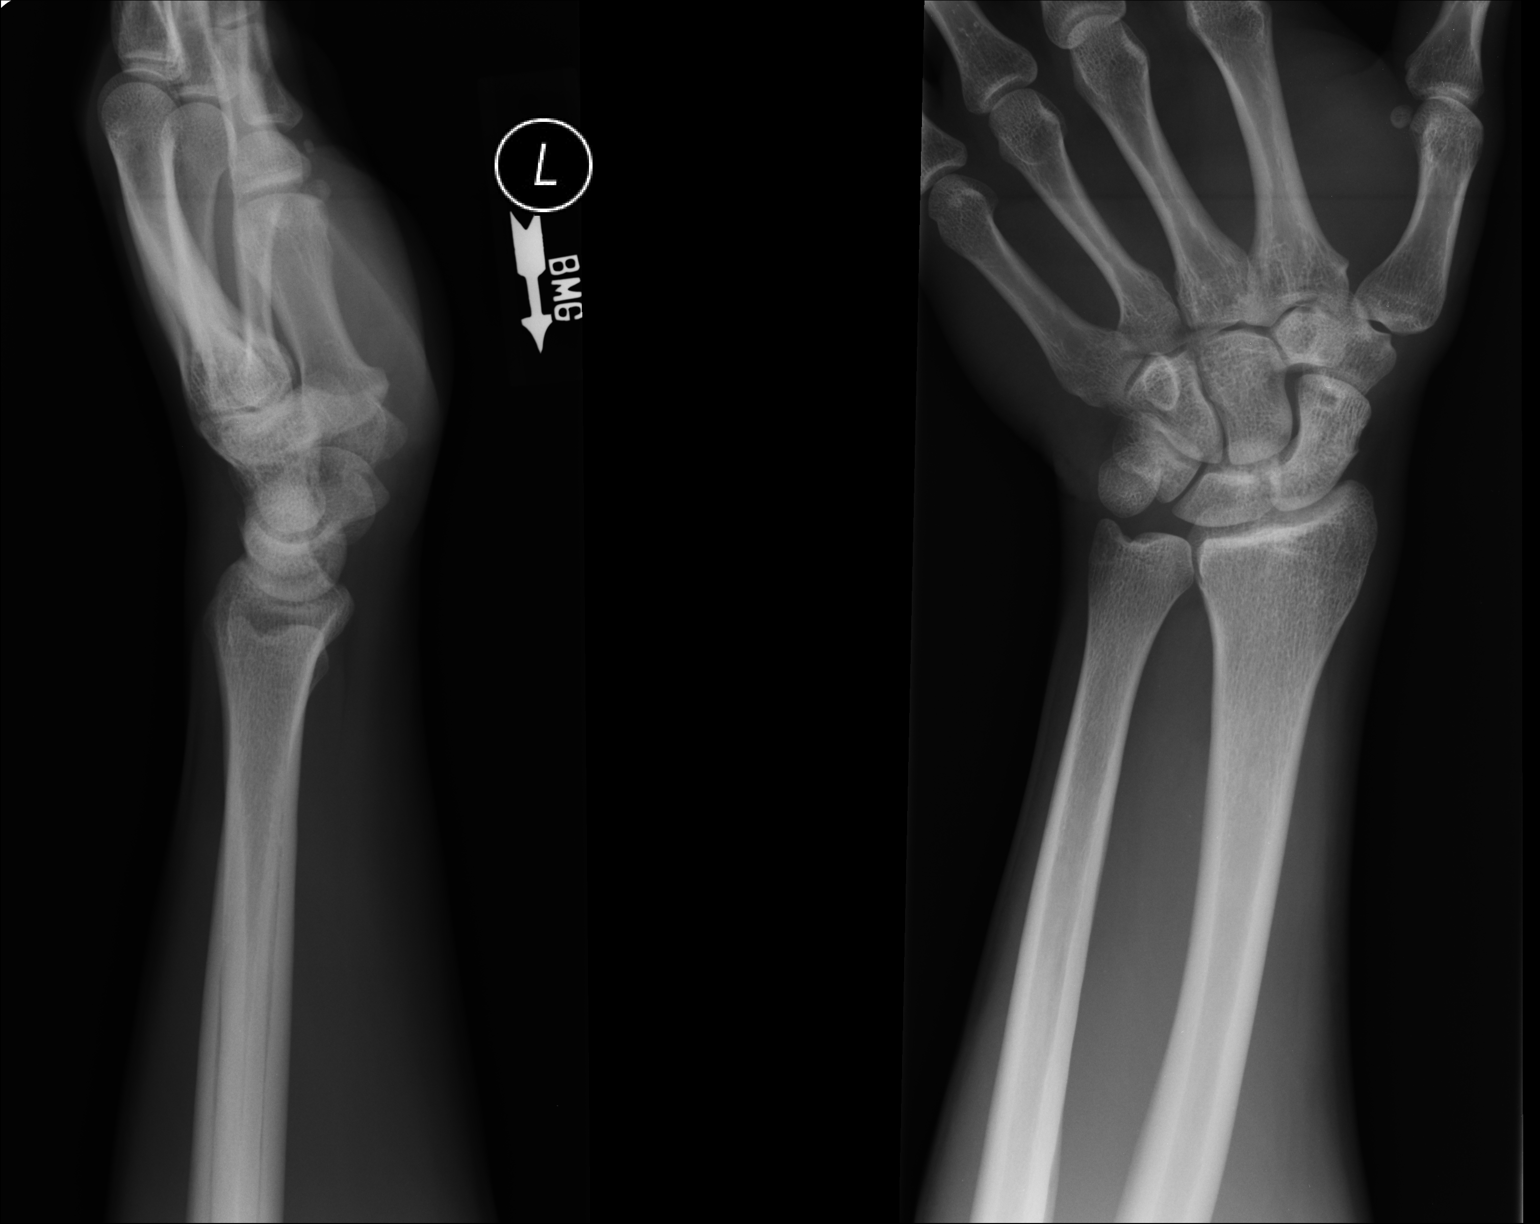

[2 of 2 positions shown; findings below may reference images not displayed]

FINDINGS: There is no evidence of fracture or dislocation. There is no
evidence of arthropathy or other focal bone abnormality. Soft
tissues are unremarkable.
IMPRESSION: Normal examination.

## 2013-10-09 NOTE — Patient Instructions (Signed)
[facility], [place], [place], and [facility] [place] [address], [place], [place] [phone]

## 2013-10-09 NOTE — Progress Notes (Addendum)
Subjective:    Patient ID: Justin Bass, male    DOB: 03/28/1986, 28 y.o.   MRN: [id number]  This chart was scribed for Ellamae Siaobert Barby Colvard, MD by Jarvis Morganaylor Ferguson, Medical Scribe. This patient was seen in Room 11 and the patient's care was started at 1:32 PM.  Chief Complaint  Patient presents with  . Wrist Injury    L wrist    255 pounds dropped on it in April     HPI HPI Comments: Justin Bass is a 28 y.o. male who presents to the [facility] complaining of an injury to his left wrist for 3 months. Patient states that back in April he had 255 pounds dropped on his left wrist during weight lifting maneuver. He states that it has bothering him for the past 3 months and has not gotten any better. Patient states that pain is exacerbated with movement and pressure. He states he had associated swelling after the injury occurred but that is now resolved. He denies any numbness.   He has been doing active release therapy with a chiropractor for 2 months without improvement  He needs better wrist function to continue crossfit training Patient Active Problem List   Diagnosis Date Noted  . Allergy 03/12/2013  . Asthma with acute exacerbation 03/12/2013   Past Medical History  Diagnosis Date  . Celiac disease   . Celiac disease   . Allergy    History reviewed. No pertinent past surgical history. No Known Allergies Prior to Admission medications   Medication Sig Start Date End Date Taking? Authorizing Provider  albuterol (PROVENTIL HFA;VENTOLIN HFA) 108 (90 BASE) MCG/ACT inhaler Inhale 2 puffs into the lungs every 4 (four) hours as needed for wheezing (cough, shortness of breath or wheezing.). 03/13/13   Chelle Tessa LernerS Jeffery, PA-C  chlorpheniramine-HYDROcodone (TUSSIONEX PENNKINETIC ER) 10-8 MG/5ML LQCR Take 5 mLs by mouth every 12 (twelve) hours as needed for cough. 06/24/13   Sondra Bargesyan M Dunn, PA-C  diclofenac sodium (VOLTAREN) 1 % GEL Apply 2 g topically 4 (four) times daily as needed.  Do not use other NSAIDs while on this medication 07/17/13   Thao P Le, DO  ipratropium (ATROVENT) 0.06 % nasal spray Place 2 sprays into the nose 3 (three) times daily. 06/24/13   Raymon Muttonyan M Dunn, PA-C  lidocaine (LIDODERM) 5 % Place 1 patch onto the skin daily. Remove & Discard patch within 12 hours or as directed by MD 07/15/13   Thao P Le, DO  nitroGLYCERIN (NITRODUR - DOSED IN MG/24 HR) 0.2 mg/hr patch Apply to affected area q24 hours, monitor for hypotension 07/15/13   Thao P Le, DO  valACYclovir (VALTREX) 500 MG tablet Take 1 tablet (500 mg total) by mouth daily. 02/06/13   Sherren MochaEva N Shaw, MD   History   Social History  . Marital Status: Married    Spouse Name: N/A    Number of Children: N/A  . Years of Education: N/A   Occupational History  . Not on file.   Social History Main Topics  . Smoking status: Former Games developermoker  . Smokeless tobacco: Former NeurosurgeonUser    Quit date: 06/08/2011  . Alcohol Use: No  . Drug Use: No  . Sexual Activity: Not on file   Other Topics Concern  . Not on file   Social History Narrative  . No narrative on file     Review of Systems  Musculoskeletal: Positive for arthralgias (left wrist) and joint swelling (after injury occurred resolved now).  Neurological: Negative for numbness.  All other systems reviewed and are negative.      Objective:   Physical Exam  Nursing note and vitals reviewed. Constitutional: He is oriented to person, place, and time. He appears well-developed and well-nourished. No distress.  HENT:  Head: Normocephalic and atraumatic.  Eyes: Conjunctivae and EOM are normal.  Neck: Neck supple.  Cardiovascular: Normal rate.   Pulmonary/Chest: Effort normal. No respiratory distress.  Musculoskeletal: Normal range of motion.  Left wrist not swollen but is tender over distal radius and in the snuff box. There is pain with thumb extension and flexion against resistance but stressing the extensor tendon does not produce pain. Collateral ligaments  are intact at [facility]. Wrist has good ROM but there is pain extension and flexion at radial carpal area  Neurological: He is alert and oriented to person, place, and time.  Skin: Skin is warm and dry.  Psychiatric: He has a normal mood and affect. His behavior is normal.    Vitals: BP 124/72  Pulse 54  Temp(Src) 98 F (36.7 C) (Oral)  Resp 18  Ht 5' 7.5" (1.715 m)  Wt 162 lb 3.2 oz (73.573 kg)  BMI 25.01 kg/m2  SpO2 98%   [facility] reading (PRIMARY) by  Dr. Merla Richesoolittle= there are no occult fractures or dislocations of the carpal bones or distal radius     Assessment & Plan:  Wrist pain chronic s/p injury April 2015---? Ligament tear/? Soft tissue injury around a now healed navicular fracture///?other  Ref hand ortho

## 2014-02-12 ENCOUNTER — Other Ambulatory Visit: Payer: Self-pay | Admitting: Family Medicine

## 2016-04-12 IMAGING — US US SCROTUM W/ DOPPLER COMPLETE
1 series · 14 of 25 positions shown · non-contrast
Comparison: None.

CLINICAL DATA: Three day history of left testicular pain and
swelling.

EXAM:
SCROTAL ULTRASOUND
DOPPLER ULTRASOUND OF THE TESTICLES
TECHNIQUE: Complete ultrasound examination of the testicles, epididymis, and
other scrotal structures was performed. Color and spectral Doppler
ultrasound were also utilized to evaluate blood flow to the
testicles.

[Series 1: us scrotum w/ doppler complete · 0.07mm/px · 14 of 39 slices shown]
[im 1/39]
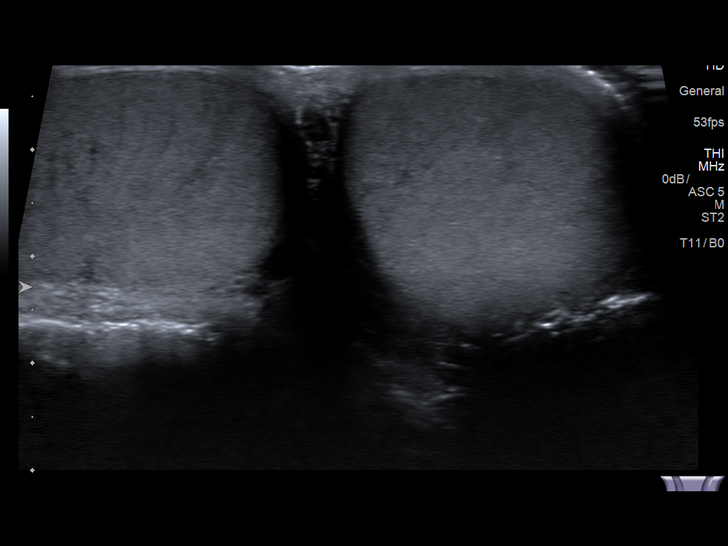
[im 4/39]
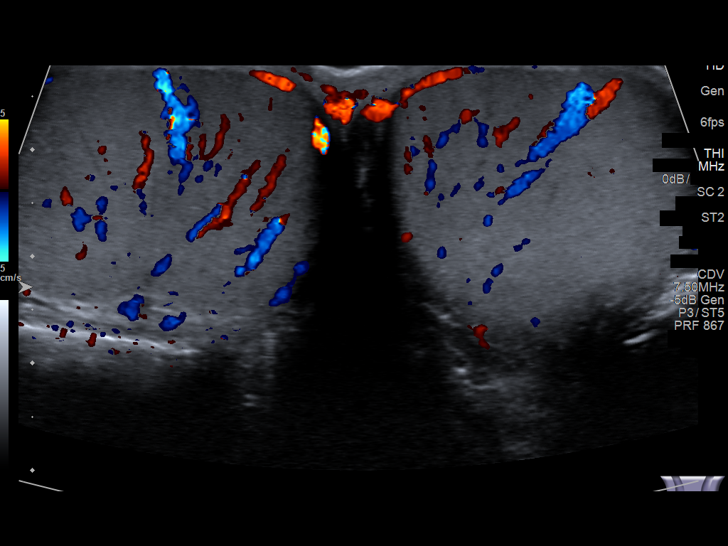
[im 7/39]
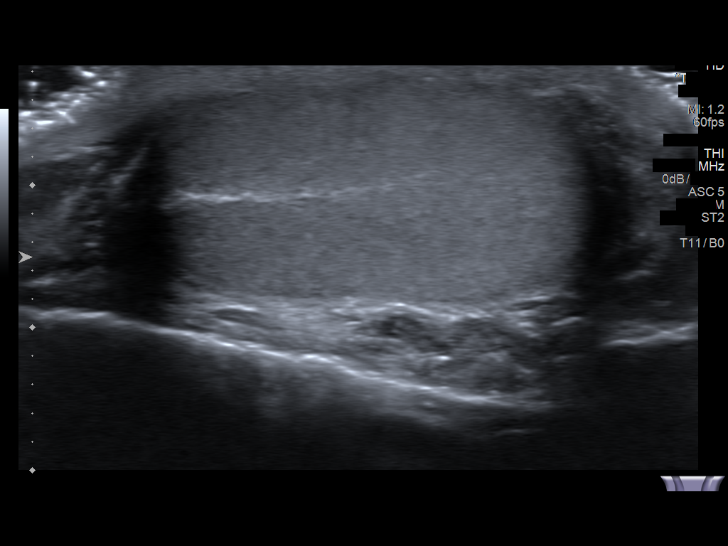
[im 10/39]
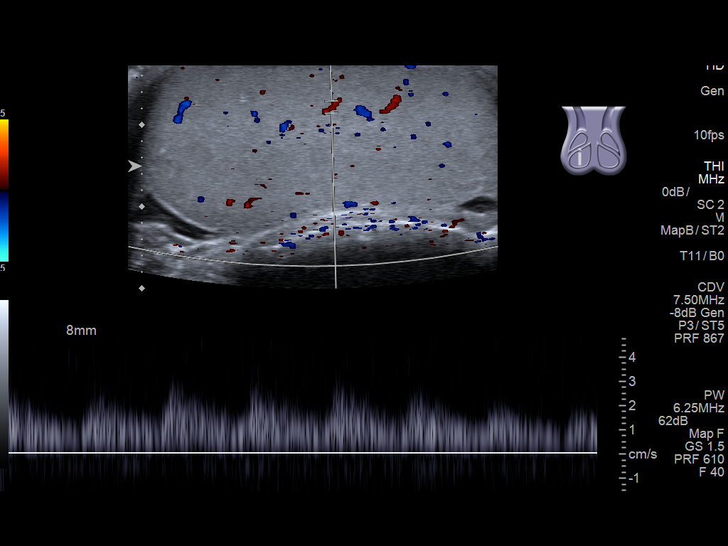
[im 13/39]
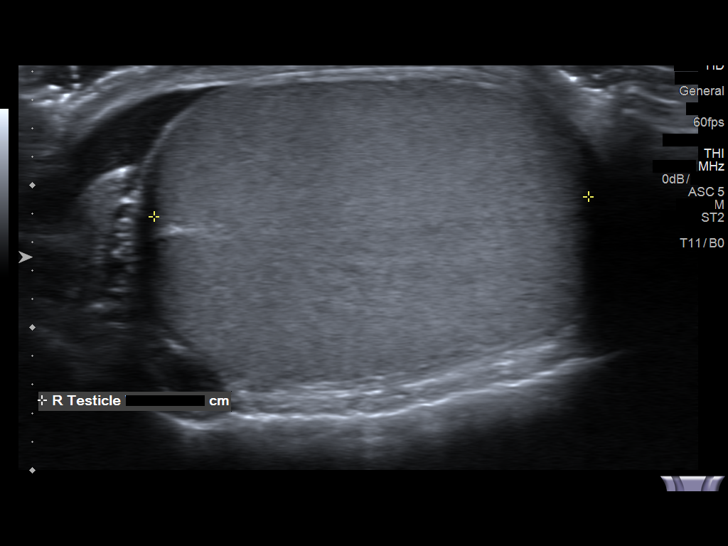
[im 15/39]
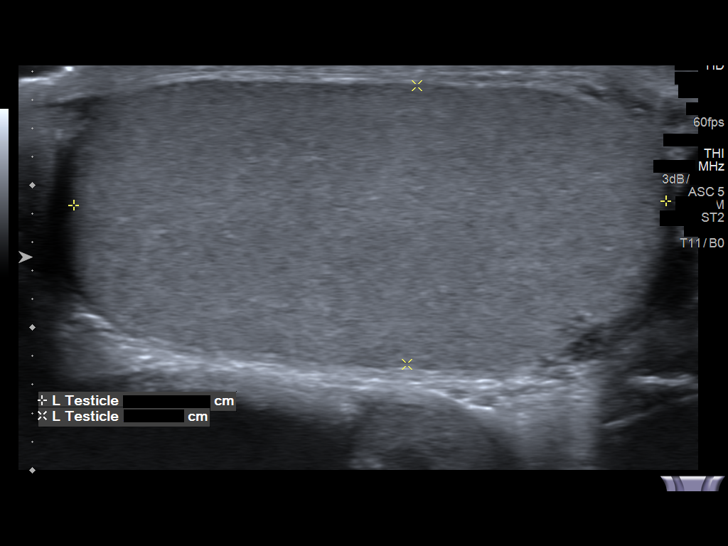
[im 18/39]
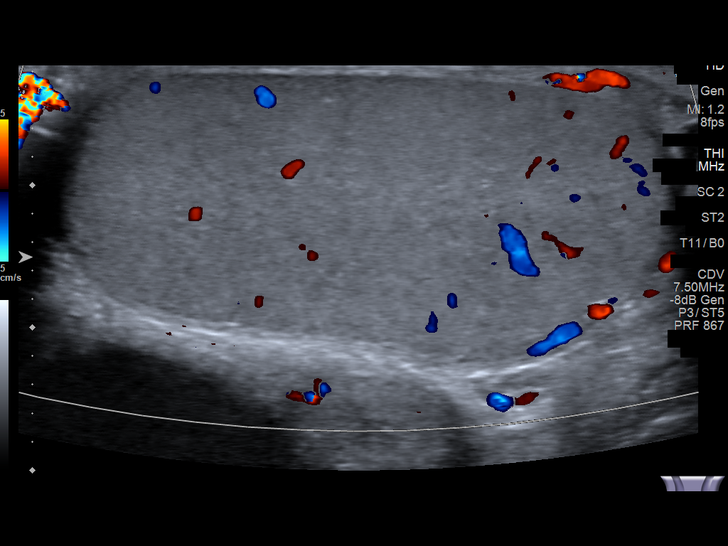
[im 21/39]
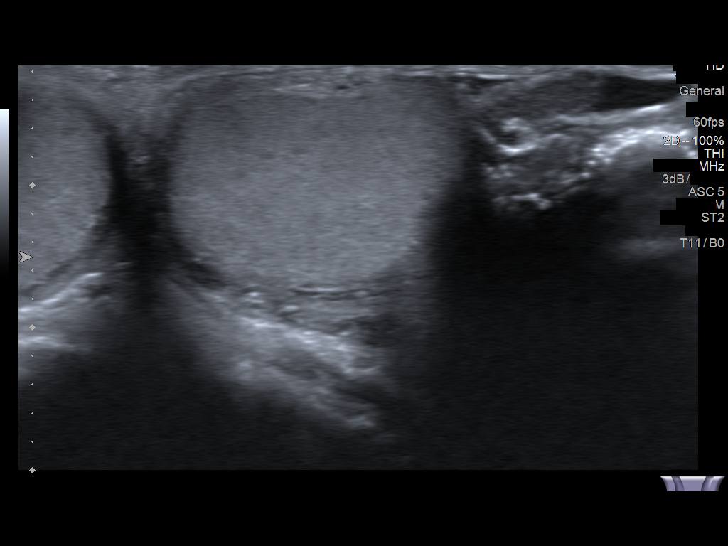
[im 24/39]
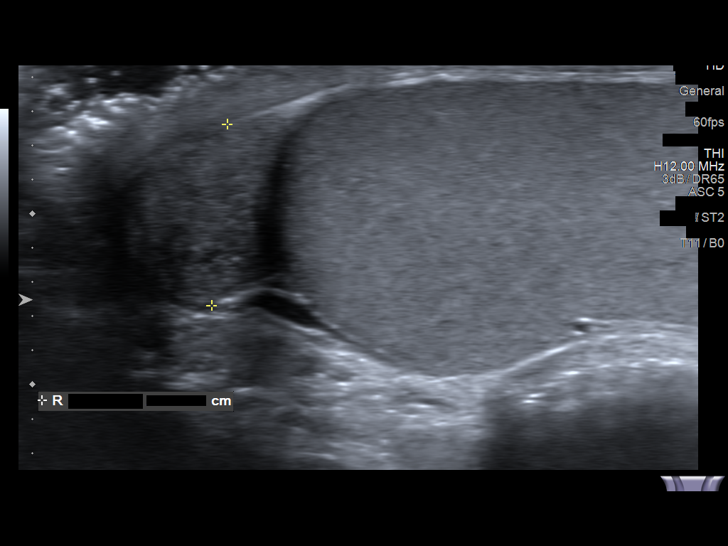
[im 26/39]
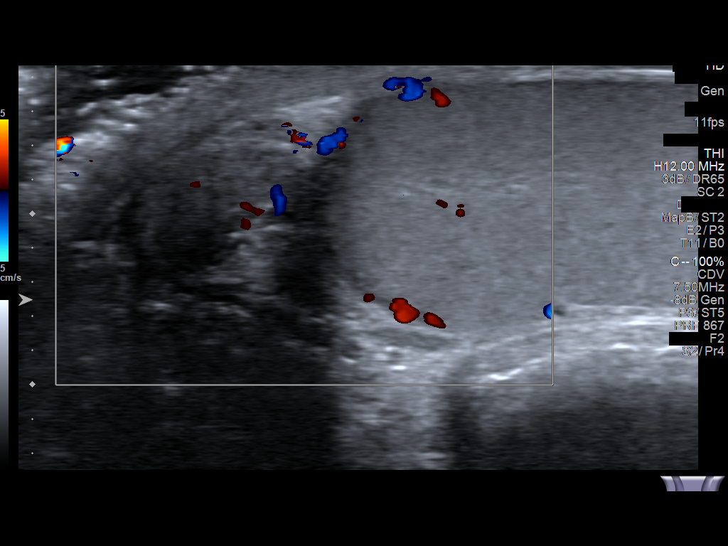
[im 29/39]
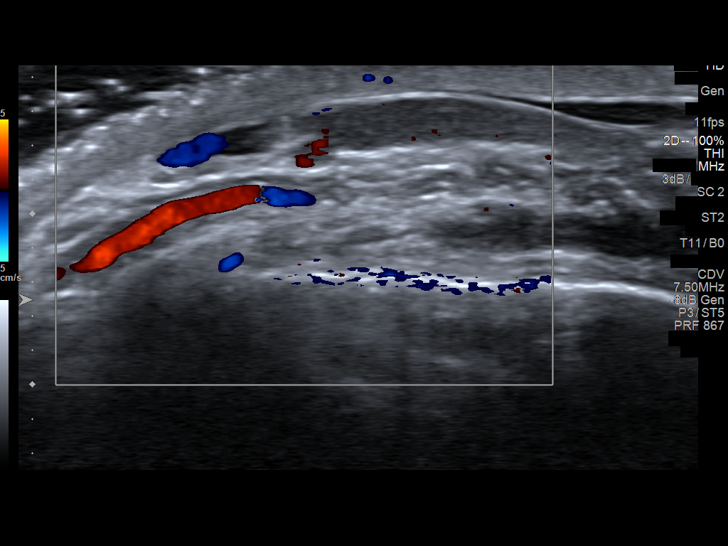
[im 32/39]
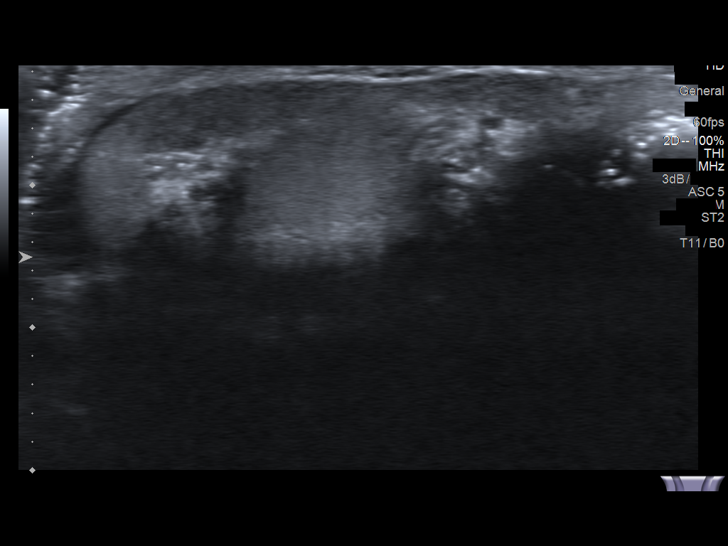
[im 35/39]
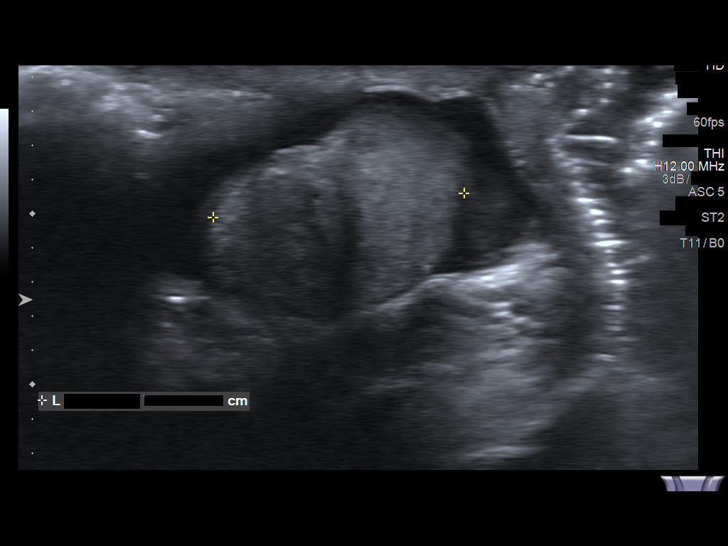
[im 39/39]
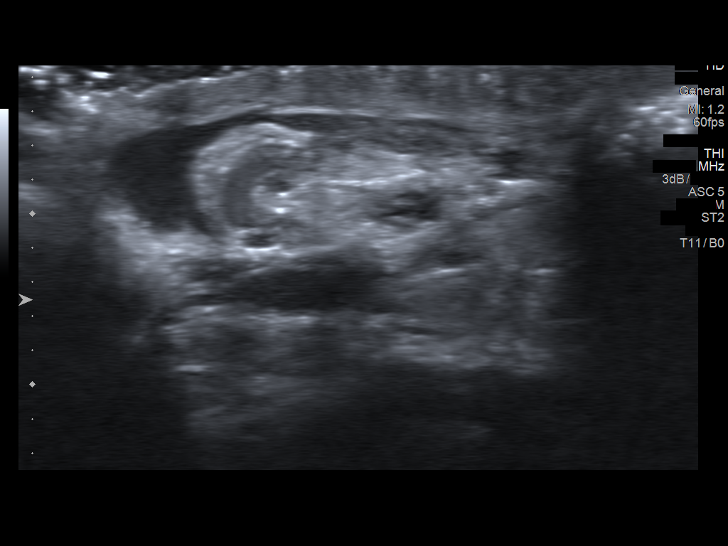

[14 of 25 positions shown; findings below may reference images not displayed]

FINDINGS: Right testicle

Measurements: 4.3 x 1.9 x 3.1 cm. No mass or microlithiasis
visualized.

Left testicle

Measurements: 4.2 x 2.0 x 3.0 cm. No mass or microlithiasis
visualized.

Right epididymis:  Normal in size and appearance.

Left epididymis:  Normal in size and appearance.

Hydrocele:  None visualized.

Varicocele:  None visualized.

Pulsed Doppler interrogation of both testes demonstrates normal low
resistance arterial and venous waveforms bilaterally.
IMPRESSION: Negative. No evidence for testicular mass, testicular torsion or
other significant abnormality.

## 2017-03-10 ENCOUNTER — Emergency Department (HOSPITAL_BASED_OUTPATIENT_CLINIC_OR_DEPARTMENT_OTHER)
Admission: EM | Admit: 2017-03-10 | Discharge: 2017-03-10 | Disposition: A | Discharge status: Home / Self Care | Payer: Self-pay | Attending: Emergency Medicine | Admitting: Emergency Medicine

## 2017-03-10 ENCOUNTER — Other Ambulatory Visit: Payer: Self-pay

## 2017-03-10 ENCOUNTER — Emergency Department (HOSPITAL_BASED_OUTPATIENT_CLINIC_OR_DEPARTMENT_OTHER): Payer: Self-pay

## 2017-03-10 ENCOUNTER — Encounter (HOSPITAL_BASED_OUTPATIENT_CLINIC_OR_DEPARTMENT_OTHER): Payer: Self-pay | Admitting: *Deleted

## 2017-03-10 DIAGNOSIS — F17210 Nicotine dependence, cigarettes, uncomplicated: Secondary | ICD-10-CM | POA: Insufficient documentation

## 2017-03-10 DIAGNOSIS — Z79899 Other long term (current) drug therapy: Secondary | ICD-10-CM | POA: Insufficient documentation

## 2017-03-10 DIAGNOSIS — J45909 Unspecified asthma, uncomplicated: Secondary | ICD-10-CM | POA: Insufficient documentation

## 2017-03-10 DIAGNOSIS — R110 Nausea: Secondary | ICD-10-CM | POA: Insufficient documentation

## 2017-03-10 DIAGNOSIS — N50819 Testicular pain, unspecified: Secondary | ICD-10-CM | POA: Insufficient documentation

## 2017-03-10 LAB — URINALYSIS, ROUTINE W REFLEX MICROSCOPIC
Glucose, UA: NEGATIVE mg/dL
Hgb urine dipstick: NEGATIVE
Ketones, ur: 15 mg/dL — AB
LEUKOCYTES UA: NEGATIVE
NITRITE: NEGATIVE
Protein, ur: NEGATIVE mg/dL
SPECIFIC GRAVITY, URINE: 1.025 (ref 1.005–1.030)
pH: 6.5 (ref 5.0–8.0)

## 2017-03-10 MED ORDER — OXYCODONE-ACETAMINOPHEN 5-325 MG PO TABS
1.0000 | ORAL_TABLET | Freq: Once | ORAL | Status: AC
  Administered 2017-03-10: 1 via ORAL
  Filled 2017-03-10: qty 1

## 2017-03-10 NOTE — Discharge Instructions (Signed)
Jehad at this time we are unsure what is causing your testicle pain.  Fortunately you are not having a torsion.  We are recommending that you follow-up with a urologist if symptoms continue to persist.  If you continue with persistent pain on one side or develop any warmth, drainage, fever, chills or pain with nausea and vomiting you should be seen urgently.

## 2017-03-10 NOTE — ED Notes (Signed)
Patient transported to Ultrasound 

## 2017-03-10 NOTE — ED Provider Notes (Signed)
[facility] EMERGENCY DEPARTMENT Provider Note   CSN: [id number] Arrival date & time: 03/10/17  1139     History   Chief Complaint Chief Complaint  Patient presents with  . Testicle Pain    HPI Justin Bass is a 31 y.o. male   Justin Bass is a 31 year old male presenting with 3 days of left-sided testicular pain.  He denies any trauma and reports the pain came on insidiously and is difficult to describe.  He denies any associated nausea, vomiting or diarrhea.  He has had no fevers or chills and has no known history of STDs.  He is monogamous with his girlfriend and they do not use protection when having sex.  He denies engaging in any anal sex.  Reports that pain is worse with palpation or if he bumps into something.  Nothing seems to help with pain.  Pain is described as sharp at times and other times dull with associated feeling of nausea.  A few days ago he felt like there was pain radiating into his groin.  He denies any heavy lifting recently and any known history of inguinal hernias.       Past Medical History:  Diagnosis Date  . Allergy   . Celiac disease   . Celiac disease     Patient Active Problem List   Diagnosis Date Noted  . Allergy 03/12/2013  . Asthma with acute exacerbation 03/12/2013    History reviewed. No pertinent surgical history.     Home Medications    Prior to Admission medications   Medication Sig Start Date End Date Taking? Authorizing Provider  albuterol (PROVENTIL HFA;VENTOLIN HFA) 108 (90 BASE) MCG/ACT inhaler Inhale 2 puffs into the lungs every 4 (four) hours as needed for wheezing (cough, shortness of breath or wheezing.). 03/13/13   Porfirio Oar, PA-C  chlorpheniramine-HYDROcodone (TUSSIONEX PENNKINETIC ER) 10-8 MG/5ML LQCR Take 5 mLs by mouth every 12 (twelve) hours as needed for cough. 06/24/13   Sondra Barges, PA-C  diclofenac sodium (VOLTAREN) 1 % GEL Apply 2 g topically 4 (four) times daily as needed. Do not use other NSAIDs  while on this medication 07/17/13   Le, Thao P, DO  ipratropium (ATROVENT) 0.06 % nasal spray Place 2 sprays into the nose 3 (three) times daily. 06/24/13   Dunn, Raymon Mutton, PA-C  lidocaine (LIDODERM) 5 % Place 1 patch onto the skin daily. Remove & Discard patch within 12 hours or as directed by MD 07/15/13   Conley Rolls, Thao P, DO  nitroGLYCERIN (NITRODUR - DOSED IN MG/24 HR) 0.2 mg/hr patch Apply to affected area q24 hours, monitor for hypotension 07/15/13   Le, Thao P, DO  valACYclovir (VALTREX) 500 MG tablet TAKE 1 TABLE (500 MG TOTAL) BY MOUTH DAILY.  NEEDS A REFILL FOR ADDITIONAL REFILLS 02/12/14   Valarie Cones Dema Severin, PA-C    Family History No family history on file.  Social History Social History   Tobacco Use  . Smoking status: Current Every Day Smoker    Packs/day: 0.50  . Smokeless tobacco: Former Neurosurgeon    Quit date: 06/08/2011  Substance Use Topics  . Alcohol use: No  . Drug use: No     Allergies   Patient has no known allergies.   Review of Systems Review of Systems  Constitutional: Negative.   HENT: Negative.   Respiratory: Negative.   Cardiovascular: Negative.   Gastrointestinal: Negative.   Genitourinary: Positive for testicular pain. Negative for difficulty urinating, discharge, dysuria, flank pain, frequency,  genital sores, hematuria, penile pain, penile swelling and scrotal swelling.  Skin: Negative.      Physical Exam Updated Vital Signs BP 114/65 (BP Location: Right Arm)   Pulse 62   Resp 16   Ht 5\' 7"  (1.702 m)   Wt 63.5 kg (140 lb)   SpO2 100%   BMI 21.93 kg/m   Physical Exam  Constitutional: He appears well-developed and well-nourished. No distress.  HENT:  Head: Normocephalic and atraumatic.  Eyes: Conjunctivae and EOM are normal. Pupils are equal, round, and reactive to light.  Neck: Normal range of motion. Neck supple.  Cardiovascular: Normal rate, regular rhythm and normal heart sounds.  No murmur heard. Pulmonary/Chest: Effort normal and breath sounds  normal. No respiratory distress.  Abdominal: Soft. Bowel sounds are normal. He exhibits no distension. There is no tenderness. There is no guarding. No hernia.  Genitourinary: Penis normal. No penile tenderness.  Genitourinary Comments: Left testicle and epididymus mod TTP.   Musculoskeletal: Normal range of motion. He exhibits no edema or deformity.  Neurological: He is alert.  Skin: Skin is warm and dry. Capillary refill takes less than 2 seconds. He is not diaphoretic.  Psychiatric: He has a normal mood and affect.     ED Treatments / Results  Labs (all labs ordered are listed, but only abnormal results are displayed) Labs Reviewed  URINALYSIS, ROUTINE W REFLEX MICROSCOPIC - Abnormal; Notable for the following components:      Result Value   Bilirubin Urine SMALL (*)    Ketones, ur 15 (*)    All other components within normal limits    EKG  EKG Interpretation None       Radiology [place] Scrotum  Result Date: 03/10/2017 CLINICAL DATA:  Three day history of left testicular pain and swelling. EXAM: SCROTAL ULTRASOUND DOPPLER ULTRASOUND OF THE TESTICLES TECHNIQUE: Complete ultrasound examination of the testicles, epididymis, and other scrotal structures was performed. Color and spectral Doppler ultrasound were also utilized to evaluate blood flow to the testicles. COMPARISON:  None. FINDINGS: Right testicle Measurements: 4.3 x 1.9 x 3.1 cm. No mass or microlithiasis visualized. Left testicle Measurements: 4.2 x 2.0 x 3.0 cm. No mass or microlithiasis visualized. Right epididymis:  Normal in size and appearance. Left epididymis:  Normal in size and appearance. Hydrocele:  None visualized. Varicocele:  None visualized. Pulsed Doppler interrogation of both testes demonstrates normal low resistance arterial and venous waveforms bilaterally. IMPRESSION: Negative. No evidence for testicular mass, testicular torsion or other significant abnormality. Electronically Signed   By: Kennith CenterEric  Mansell M.D.    On: 03/10/2017 13:41   [place] Scrotom Doppler  Result Date: 03/10/2017 CLINICAL DATA:  Three day history of left testicular pain and swelling. EXAM: SCROTAL ULTRASOUND DOPPLER ULTRASOUND OF THE TESTICLES TECHNIQUE: Complete ultrasound examination of the testicles, epididymis, and other scrotal structures was performed. Color and spectral Doppler ultrasound were also utilized to evaluate blood flow to the testicles. COMPARISON:  None. FINDINGS: Right testicle Measurements: 4.3 x 1.9 x 3.1 cm. No mass or microlithiasis visualized. Left testicle Measurements: 4.2 x 2.0 x 3.0 cm. No mass or microlithiasis visualized. Right epididymis:  Normal in size and appearance. Left epididymis:  Normal in size and appearance. Hydrocele:  None visualized. Varicocele:  None visualized. Pulsed Doppler interrogation of both testes demonstrates normal low resistance arterial and venous waveforms bilaterally. IMPRESSION: Negative. No evidence for testicular mass, testicular torsion or other significant abnormality. Electronically Signed   By: Kennith CenterEric  Mansell M.D.   On: 03/10/2017 13:41  Procedures Procedures (including critical care time)  Medications Ordered in ED Medications  oxyCODONE-acetaminophen (PERCOCET/ROXICET) 5-325 MG per tablet 1 tablet (1 tablet Oral Given 03/10/17 1357)     Initial Impression / Assessment and Plan / ED Course  I have reviewed the triage vital signs and the nursing notes.  Pertinent labs & imaging results that were available during my care of the patient were reviewed by me and considered in my medical decision making (see chart for details).      Vladimir CreeksDavid Acrey is a 31 year old male presenting with 3 days of stable testicular pain on the left side.  Ultrasound showed no testicular torsion or significant abnormality.  Exam was significant for moderately tender left testicle and epididymis without warmth or size discrepancy.  Left testicle was slightly descended compared to the right.  Patient  had no constitutional signs or symptoms.  UA was unremarkable.  Patient denies history of STDs, however cannot rule out epididymitis at this time.  Recommending follow-up with urologist if symptoms persist.  Return precautions discussed including persistent unilateral pain, warmth, drainage, fever, chills or pain with nausea and vomiting.   Final Clinical Impressions(s) / ED Diagnoses   Final diagnoses:  Testicular pain    ED Discharge Orders    None     Taya Ashbaugh L. Myrtie SomanWarden, MD [facility] Resident PGY-2 03/10/2017 2:34 PM     Renne MuscaWarden, Nihira Puello L, MD 03/10/17 1435    Little, Ambrose Finlandachel Morgan, MD 03/13/17 2053

## 2017-03-10 NOTE — ED Triage Notes (Signed)
Pt c/o left testicle pain x 3 days , denies injury

## 2017-04-28 ENCOUNTER — Emergency Department (HOSPITAL_COMMUNITY)
Admission: EM | Admit: 2017-04-28 | Discharge: 2017-04-28 | Disposition: A | Discharge status: Home / Self Care | Payer: No Typology Code available for payment source | Attending: Emergency Medicine | Admitting: Emergency Medicine

## 2017-04-28 ENCOUNTER — Emergency Department (HOSPITAL_COMMUNITY): Payer: No Typology Code available for payment source

## 2017-04-28 ENCOUNTER — Encounter (HOSPITAL_COMMUNITY): Payer: Self-pay | Admitting: Emergency Medicine

## 2017-04-28 DIAGNOSIS — F17200 Nicotine dependence, unspecified, uncomplicated: Secondary | ICD-10-CM | POA: Diagnosis not present

## 2017-04-28 DIAGNOSIS — Y939 Activity, unspecified: Secondary | ICD-10-CM | POA: Diagnosis not present

## 2017-04-28 DIAGNOSIS — M79605 Pain in left leg: Secondary | ICD-10-CM | POA: Insufficient documentation

## 2017-04-28 DIAGNOSIS — Y998 Other external cause status: Secondary | ICD-10-CM | POA: Insufficient documentation

## 2017-04-28 DIAGNOSIS — Y92410 Unspecified street and highway as the place of occurrence of the external cause: Secondary | ICD-10-CM | POA: Diagnosis not present

## 2017-04-28 DIAGNOSIS — S8992XA Unspecified injury of left lower leg, initial encounter: Secondary | ICD-10-CM | POA: Diagnosis present

## 2017-04-28 LAB — BASIC METABOLIC PANEL
Anion gap: 10 (ref 5–15)
BUN: 7 mg/dL (ref 6–20)
CALCIUM: 9.2 mg/dL (ref 8.9–10.3)
CO2: 23 mmol/L (ref 22–32)
CREATININE: 1.05 mg/dL (ref 0.61–1.24)
Chloride: 105 mmol/L (ref 101–111)
GFR calc Af Amer: >60 mL/min (ref >60)
Glucose, Bld: 110 mg/dL — ABNORMAL HIGH (ref 65–99)
Potassium: 3.8 mmol/L (ref 3.5–5.1)
SODIUM: 138 mmol/L (ref 135–145)

## 2017-04-28 LAB — CBC WITH DIFFERENTIAL/PLATELET
BASOS PCT: 0 %
Basophils Absolute: 0 10*3/uL (ref 0.0–0.1)
EOS PCT: 1 %
Eosinophils Absolute: 0.1 10*3/uL (ref 0.0–0.7)
HCT: 46.4 % (ref 39.0–52.0)
Hemoglobin: 16.3 g/dL (ref 13.0–17.0)
LYMPHS ABS: 2 10*3/uL (ref 0.7–4.0)
Lymphocytes Relative: 20 %
MCH: 31.6 pg (ref 26.0–34.0)
MCHC: 35.1 g/dL (ref 30.0–36.0)
MCV: 89.9 fL (ref 78.0–100.0)
MONOS PCT: 9 %
Monocytes Absolute: 0.9 10*3/uL (ref 0.1–1.0)
Neutro Abs: 7 10*3/uL (ref 1.7–7.7)
Neutrophils Relative %: 70 %
PLATELETS: 134 10*3/uL — AB (ref 150–400)
RBC: 5.16 MIL/uL (ref 4.22–5.81)
RDW: 13.2 % (ref 11.5–15.5)
WBC: 9.9 10*3/uL (ref 4.0–10.5)
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
## 2017-04-28 MED ORDER — HYDROMORPHONE HCL 1 MG/ML IJ SOLN
INTRAMUSCULAR | Status: AC | PRN
  Administered 2017-04-28: 1 mg via INTRAVENOUS

## 2017-04-28 MED ORDER — IBUPROFEN 400 MG PO TABS: 600.0000 mg | ORAL_TABLET | Freq: Once | ORAL | Status: DC

## 2017-04-28 MED ORDER — HYDROMORPHONE HCL 1 MG/ML IJ SOLN
1.0000 mg | Freq: Once | INTRAMUSCULAR | Status: DC
  Filled 2017-04-28: qty 1

## 2017-04-28 NOTE — ED Notes (Signed)
Patient transported to X-ray 
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
## 2017-04-28 NOTE — ED Notes (Signed)
Returned  From x-ray.
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
## 2017-04-29 ENCOUNTER — Encounter (HOSPITAL_BASED_OUTPATIENT_CLINIC_OR_DEPARTMENT_OTHER): Payer: Self-pay | Admitting: *Deleted

## 2017-09-14 ENCOUNTER — Emergency Department (HOSPITAL_COMMUNITY)
Admission: EM | Admit: 2017-09-14 | Discharge: 2017-09-14 | Disposition: A | Discharge status: Home / Self Care | Payer: Managed Care, Other (non HMO) | Attending: Emergency Medicine | Admitting: Emergency Medicine

## 2017-09-14 ENCOUNTER — Encounter (HOSPITAL_COMMUNITY): Payer: Self-pay | Admitting: Emergency Medicine

## 2017-09-14 DIAGNOSIS — F17210 Nicotine dependence, cigarettes, uncomplicated: Secondary | ICD-10-CM | POA: Insufficient documentation

## 2017-09-14 DIAGNOSIS — T401X1A Poisoning by heroin, accidental (unintentional), initial encounter: Secondary | ICD-10-CM | POA: Insufficient documentation

## 2017-09-14 DIAGNOSIS — Z79899 Other long term (current) drug therapy: Secondary | ICD-10-CM | POA: Insufficient documentation

## 2017-09-14 DIAGNOSIS — J45909 Unspecified asthma, uncomplicated: Secondary | ICD-10-CM | POA: Insufficient documentation

## 2017-09-14 DIAGNOSIS — T40601A Poisoning by unspecified narcotics, accidental (unintentional), initial encounter: Secondary | ICD-10-CM

## 2017-09-14 LAB — CBC
HEMATOCRIT: 47.5 % (ref 39.0–52.0)
HEMOGLOBIN: 16.3 g/dL (ref 13.0–17.0)
MCH: 30.8 pg (ref 26.0–34.0)
MCHC: 34.3 g/dL (ref 30.0–36.0)
MCV: 89.6 fL (ref 78.0–100.0)
Platelets: 164 10*3/uL (ref 150–400)
RBC: 5.3 MIL/uL (ref 4.22–5.81)
RDW: 13.3 % (ref 11.5–15.5)
WBC: 15.2 10*3/uL — ABNORMAL HIGH (ref 4.0–10.5)

## 2017-09-14 LAB — COMPREHENSIVE METABOLIC PANEL
ALT: 14 U/L — ABNORMAL LOW (ref 17–63)
AST: 20 U/L (ref 15–41)
Albumin: 4.3 g/dL (ref 3.5–5.0)
Alkaline Phosphatase: 55 U/L (ref 38–126)
Anion gap: 10 (ref 5–15)
BUN: 9 mg/dL (ref 6–20)
CHLORIDE: 101 mmol/L (ref 101–111)
CO2: 27 mmol/L (ref 22–32)
Calcium: 9.3 mg/dL (ref 8.9–10.3)
Creatinine, Ser: 1.13 mg/dL (ref 0.61–1.24)
Glucose, Bld: 133 mg/dL — ABNORMAL HIGH (ref 65–99)
POTASSIUM: 5 mmol/L (ref 3.5–5.1)
SODIUM: 138 mmol/L (ref 135–145)
Total Bilirubin: 1 mg/dL (ref 0.3–1.2)
Total Protein: 7.4 g/dL (ref 6.5–8.1)

## 2017-09-14 LAB — ETHANOL

## 2017-09-14 MED ORDER — ONDANSETRON 4 MG PO TBDP
8.0000 mg | ORAL_TABLET | Freq: Once | ORAL | Status: AC
  Administered 2017-09-14: 8 mg via ORAL
  Filled 2017-09-14: qty 2

## 2017-09-14 NOTE — ED Notes (Signed)
Pt verbalized understanding of d/c instructions and has no further questions, VSS, NAD. Pt driven home by friend.

## 2017-09-14 NOTE — ED Triage Notes (Addendum)
Pt reports receiving narcan PTA by EMS. Unable to provide story of events. Reports taking heroin - unsure how much at approx 5PM. Refused EMS transport but still feeling drowsy. Denies pain

## 2017-09-14 NOTE — ED Provider Notes (Signed)
[facility] EMERGENCY DEPARTMENT Provider Note   CSN: [id number] Arrival date & time: 09/14/17  2158     History   Chief Complaint Chief Complaint  Patient presents with  . Drug Overdose    HPI Justin Bass is a 32 y.o. male.   32 year old male presents to the emergency department for evaluation of overdose.  Patient reports using heroin prior to arrival.  Drug use was around 1700.  He was given Narcan prior to arrival by EMS.  The patient estimates he received Narcan at 1930.  Initially refused EMS transport, but continued to feel drowsy.  He has no complaints of chest pain.  States that he feels as though his breathing is "off" with some mild nausea.  No vomiting.  Denies suicidal intent behind ingestion.     Past Medical History:  Diagnosis Date  . Allergy   . Celiac disease   . Celiac disease     Patient Active Problem List   Diagnosis Date Noted  . Allergy 03/12/2013  . Asthma with acute exacerbation 03/12/2013    History reviewed. No pertinent surgical history.      Home Medications    Prior to Admission medications   Medication Sig Start Date End Date Taking? Authorizing Provider  albuterol (PROVENTIL HFA;VENTOLIN HFA) 108 (90 BASE) MCG/ACT inhaler Inhale 2 puffs into the lungs every 4 (four) hours as needed for wheezing (cough, shortness of breath or wheezing.). 03/13/13   Porfirio Oar, PA-C  chlorpheniramine-HYDROcodone (TUSSIONEX PENNKINETIC ER) 10-8 MG/5ML LQCR Take 5 mLs by mouth every 12 (twelve) hours as needed for cough. 06/24/13   Sondra Barges, PA-C  diclofenac sodium (VOLTAREN) 1 % GEL Apply 2 g topically 4 (four) times daily as needed. Do not use other NSAIDs while on this medication 07/17/13   Le, Thao P, DO  ipratropium (ATROVENT) 0.06 % nasal spray Place 2 sprays into the nose 3 (three) times daily. 06/24/13   Dunn, Raymon Mutton, PA-C  lidocaine (LIDODERM) 5 % Place 1 patch onto the skin daily. Remove & Discard patch within 12  hours or as directed by MD 07/15/13   Conley Rolls, Thao P, DO  nitroGLYCERIN (NITRODUR - DOSED IN MG/24 HR) 0.2 mg/hr patch Apply to affected area q24 hours, monitor for hypotension 07/15/13   Le, Thao P, DO  valACYclovir (VALTREX) 500 MG tablet TAKE 1 TABLE (500 MG TOTAL) BY MOUTH DAILY.  NEEDS A REFILL FOR ADDITIONAL REFILLS 02/12/14   Valarie Cones Dema Severin, PA-C    Family History No family history on file.  Social History Social History   Tobacco Use  . Smoking status: Current Every Day Smoker    Packs/day: 0.50  . Smokeless tobacco: Never Used  Substance Use Topics  . Alcohol use: No    Frequency: Never  . Drug use: Yes    Comment: heroin     Allergies   Patient has no known allergies.   Review of Systems Review of Systems Ten systems reviewed and are negative for acute change, except as noted in the HPI.    Physical Exam Updated Vital Signs BP 116/77 (BP Location: Right Arm)   Pulse 74   Temp (!) 97.4 F (36.3 C) (Oral)   Resp 16   Ht 5\' 7"  (1.702 m)   Wt 59 kg (130 lb)   SpO2 98%   BMI 20.36 kg/m   Physical Exam  Constitutional: He is oriented to person, place, and time. He appears well-developed and well-nourished. No distress.  Nontoxic appearing. Drowsy.  HENT:  Head: Normocephalic and atraumatic.  Eyes: Conjunctivae and EOM are normal. No scleral icterus.  Neck: Normal range of motion.  Cardiovascular: Normal rate, regular rhythm and intact distal pulses.  Pulmonary/Chest: Effort normal. No stridor. No respiratory distress. He has no wheezes. He has no rales.  Lungs CTAB. SpO2 97-99% on room air.  Musculoskeletal: Normal range of motion.  Neurological: He is alert and oriented to person, place, and time. He exhibits normal muscle tone. Coordination normal.  GCS 15. Speech is goal oriented. Moving all extremities spontaneously.  Skin: Skin is warm and dry. No rash noted. He is not diaphoretic. No erythema. No pallor.  Psychiatric: He has a normal mood and affect. His  behavior is normal.  Nursing note and vitals reviewed.    ED Treatments / Results  Labs (all labs ordered are listed, but only abnormal results are displayed) Labs Reviewed  COMPREHENSIVE METABOLIC PANEL - Abnormal; Notable for the following components:      Result Value   Glucose, Bld 133 (*)    ALT 14 (*)    All other components within normal limits  CBC - Abnormal; Notable for the following components:   WBC 15.2 (*)    All other components within normal limits  ETHANOL  RAPID URINE DRUG SCREEN, HOSP PERFORMED    EKG None  Radiology No results found.  Procedures Procedures (including critical care time)  Medications Ordered in ED Medications  ondansetron (ZOFRAN-ODT) disintegrating tablet 8 mg (8 mg Oral Given 09/14/17 2303)    11:02 PM Patient now stating he wants to leave the ED. He has been monitored in the department for 1 hour without decompensation or the need for additional narcan. Reports heroin use at ~1700.  Leukocytosis c/w stress of recent overdose.   Initial Impression / Assessment and Plan / ED Course  I have reviewed the triage vital signs and the nursing notes.  Pertinent labs & imaging results that were available during my care of the patient were reviewed by me and considered in my medical decision making (see chart for details).     [age] year old male presents to the emergency department for evaluation after accidental heroin overdose.  He was given Narcan prior to arrival.  Observed in the emergency department for approximately 1 hour without clinical decompensation.  Vitals have remained stable.  No hypoxia.  Lungs clear.  Patient mentating well, clear speech.  He is ambulatory with steady gait.  Patient requesting discharge.  I do not believe this is unreasonable.  He has been given a resource guide should he desire outpatient counseling for substance abuse.  Return precautions discussed and provided.  Patient discharged in stable condition with no  unaddressed concerns.   Final Clinical Impressions(s) / ED Diagnoses   Final diagnoses:  Opiate overdose, accidental or unintentional, initial encounter [facility](HCC)    ED Discharge Orders    None       Antony MaduraHumes, Milanie Rosenfield, PA-C 09/14/17 2347    Margarita Grizzleay, Danielle, MD 09/15/17 [phone]

## 2017-09-14 NOTE — ED Notes (Signed)
Pt stating that he wants to leave now after speaking with provider. Tresa EndoKelly PA notified.

## 2018-06-10 ENCOUNTER — Encounter: Payer: Self-pay | Admitting: Family Medicine

## 2018-06-10 ENCOUNTER — Ambulatory Visit: Payer: Self-pay | Admitting: Family Medicine

## 2018-06-10 ENCOUNTER — Other Ambulatory Visit: Payer: Self-pay

## 2018-06-10 DIAGNOSIS — F331 Major depressive disorder, recurrent, moderate: Secondary | ICD-10-CM

## 2018-06-10 DIAGNOSIS — F411 Generalized anxiety disorder: Secondary | ICD-10-CM | POA: Insufficient documentation

## 2018-06-10 MED ORDER — ESCITALOPRAM OXALATE 10 MG PO TABS: 10.0000 mg | ORAL_TABLET | Freq: Every day | ORAL | 1 refills | Status: DC

## 2018-06-10 NOTE — Patient Instructions (Signed)
Get help right away if:  You have thoughts of hurting yourself or others. If you ever feel like you may hurt yourself or others, or have thoughts about taking your own life, get help right away. You can go to your nearest emergency department or call:  Your local emergency services (911 in the [place]  A suicide crisis helpline, such as the National Suicide Prevention Lifeline at 715-726-6682. This is open 24-hours a day.      Living With Depression Everyone experiences occasional disappointment, sadness, and loss in their lives. When you are feeling down, blue, or sad for at least 2 weeks in a row, it may mean that you have depression. Depression can affect your thoughts and feelings, relationships, daily activities, and physical health. It is caused by changes in the way your brain functions. If you receive a diagnosis of depression, your health care provider will tell you which type of depression you have and what treatment options are available to you. If you are living with depression, there are ways to help you recover from it and also ways to prevent it from coming back. How to cope with lifestyle changes Coping with stress     Stress is your body's reaction to life changes and events, both good and bad. Stressful situations may include:  Getting married.  The death of a spouse.  Losing a job.  Retiring.  Having a baby. Stress can last just a few hours or it can be ongoing. Stress can play a major role in depression, so it is important to learn both how to cope with stress and how to think about it differently. Talk with your health care provider or a counselor if you would like to learn more about stress reduction. He or she may suggest some stress reduction techniques, such as:  Music therapy. This can include creating music or listening to music. Choose music that you enjoy and that inspires you.  Mindfulness-based meditation. This kind of meditation can be done while  sitting or walking. It involves being aware of your normal breaths, rather than trying to control your breathing.  Centering prayer. This is a kind of meditation that involves focusing on a spiritual word or phrase. Choose a word, phrase, or sacred image that is meaningful to you and that brings you peace.  Deep breathing. To do this, expand your stomach and inhale slowly through your nose. Hold your breath for 3-5 seconds, then exhale slowly, allowing your stomach muscles to relax.  Muscle relaxation. This involves intentionally tensing muscles then relaxing them. Choose a stress reduction technique that fits your lifestyle and personality. Stress reduction techniques take time and practice to develop. Set aside 5-15 minutes a day to do them. Therapists can offer training in these techniques. The training may be covered by some insurance plans. Other things you can do to manage stress include:  Keeping a stress diary. This can help you learn what triggers your stress and ways to control your response.  Understanding what your limits are and saying no to requests or events that lead to a schedule that is too full.  Thinking about how you respond to certain situations. You may not be able to control everything, but you can control how you react.  Adding humor to your life by watching funny films or TV shows.  Making time for activities that help you relax and not feeling guilty about spending your time this way.  Medicines Your health care provider may suggest certain  medicines if he or she feels that they will help improve your condition. Avoid using alcohol and other substances that may prevent your medicines from working properly (may interact). It is also important to:  Talk with your pharmacist or health care provider about all the medicines that you take, their possible side effects, and what medicines are safe to take together.  Make it your goal to take part in all treatment decisions  (shared decision-making). This includes giving input on the side effects of medicines. It is best if shared decision-making with your health care provider is part of your total treatment plan. If your health care provider prescribes a medicine, you may not notice the full benefits of it for 4-8 weeks. Most people who are treated for depression need to be on medicine for at least 6-12 months after they feel better. If you are taking medicines as part of your treatment, do not stop taking medicines without first talking to your health care provider. You may need to have the medicine slowly decreased (tapered) over time to decrease the risk of harmful side effects. Relationships Your health care provider may suggest family therapy along with individual therapy and drug therapy. While there may not be family problems that are causing you to feel depressed, it is still important to make sure your family learns as much as they can about your mental health. Having your family's support can help make your treatment successful. How to recognize changes in your condition Everyone has a different response to treatment for depression. Recovery from major depression happens when you have not had signs of major depression for two months. This may mean that you will start to:  Have more interest in doing activities.  Feel less hopeless than you did 2 months ago.  Have more energy.  Overeat less often, or have better or improving appetite.  Have better concentration. Your health care provider will work with you to decide the next steps in your recovery. It is also important to recognize when your condition is getting worse. Watch for these signs:  Having fatigue or low energy.  Eating too much or too little.  Sleeping too much or too little.  Feeling restless, agitated, or hopeless.  Having trouble concentrating or making decisions.  Having unexplained physical complaints.  Feeling irritable, angry, or  aggressive. Get help as soon as you or your family members notice these symptoms coming back. How to get support and help from others How to talk with friends and family members about your condition  Talking to friends and family members about your condition can provide you with one way to get support and guidance. Reach out to trusted friends or family members, explain your symptoms to them, and let them know that you are working with a health care provider to treat your depression. Financial resources Not all insurance plans cover mental health care, so it is important to check with your insurance carrier. If paying for co-pays or counseling services is a problem, search for a local or county mental health care center. They may be able to offer public mental health care services at low or no cost when you are not able to see a private health care provider. If you are taking medicine for depression, you may be able to get the generic form, which may be less expensive. Some makers of prescription medicines also offer help to patients who cannot afford the medicines they need. Follow these instructions at home:   Get  the right amount and quality of sleep.  Cut down on using caffeine, tobacco, alcohol, and other potentially harmful substances.  Try to exercise, such as walking or lifting small weights.  Take over-the-counter and prescription medicines only as told by your health care provider.  Eat a healthy diet that includes plenty of vegetables, fruits, whole grains, low-fat dairy products, and lean protein. Do not eat a lot of foods that are high in solid fats, added sugars, or salt.  Keep all follow-up visits as told by your health care provider. This is important. Contact a health care provider if:  You stop taking your antidepressant medicines, and you have any of these symptoms: ? Nausea. ? Headache. ? Feeling lightheaded. ? Chills and body aches. ? Not being able to sleep  (insomnia).  You or your friends and family think your depression is getting worse.   Summary  If you are living with depression, there are ways to help you recover from it and also ways to prevent it from coming back.  Work with your health care team to create a management plan that includes counseling, stress management techniques, and healthy lifestyle habits. This information is not intended to replace advice given to you by your health care provider. Make sure you discuss any questions you have with your health care provider. Document Released: 02/27/2016 Document Revised: 02/27/2016 Document Reviewed: 02/27/2016 Elsevier Interactive Patient Education  2019 [employer].

## 2018-06-10 NOTE — Assessment & Plan Note (Signed)
-  Discussed treatment options and recommend starting lexapro. He and I both agree that he should stay away from any addictive or habit forming substances for management of his anxiety (ie. Benzodiazepines).  -Recommend continued therapy -Discussed plan to call 911 if having increasing suicidal ideation.  -I will see him back in 2 weeks

## 2018-06-10 NOTE — Progress Notes (Signed)
Justin Bass - 33 y.o. male MRN [id number]  Date of birth: 08/31/1985  Subjective Chief Complaint  Patient presents with  . Establish Care  . Depression  . Anxiety    HPI Justin Bass is a 33 y.o. male here today to establish with new pcp.  He has a history of depression and is here to discuss worsening depression and anxiety.  He was previously treated with a medication "that starts with L".  He recalls he did well on this however quit taking due to cost.  Over the past few months he feels that he has had increasing anxiety with short temper, poor sleep, decreased appetite and energy levels.  He did score question 9 on PHQ as a 2 however denies suicidal thoughts and/or plan at this time.  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  He reports he has abstained from this substances since that time.  His depression and anxiety have interfered with him getting and keeping jobs over the years.  He is currently seeing a therapist in [facility], this is somewhat helpful.   Depression screen PHQ 2/9 06/10/2018  Decreased Interest 3  Down, Depressed, Hopeless 3  PHQ - 2 Score 6  Altered sleeping 3  Tired, decreased energy 3  Change in appetite 3  Feeling bad or failure about yourself  3  Trouble concentrating 3  Moving slowly or fidgety/restless 3  Suicidal thoughts 2  PHQ-9 Score 26   GAD 7 : Generalized Anxiety Score 06/10/2018  Nervous, Anxious, on Edge 3  Control/stop worrying 3  Worry too much - different things 3  Trouble relaxing 3  Restless 3  Easily annoyed or irritable 3  Afraid - awful might happen 3  Total GAD 7 Score 21      No Known Allergies  Past Medical History:  Diagnosis Date  . Allergy   . Celiac disease   . Celiac disease     No past surgical history on file.  Social History   Socioeconomic History  . Marital status: Divorced    Spouse name: Not on file  . Number of children: Not  on file  . Years of education: Not on file  . Highest education level: Not on file  Occupational History  . Not on file  Social Needs  . Financial resource strain: Not on file  . Food insecurity:    Worry: Not on file    Inability: Not on file  . Transportation needs:    Medical: Not on file    Non-medical: Not on file  Tobacco Use  . Smoking status: Current Some Day Smoker    Packs/day: 0.50    Types: Cigars  . Smokeless tobacco: Never Used  Substance and Sexual Activity  . Alcohol use: No    Frequency: Never  . 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 Sexual activity: Not on file  Lifestyle  . Physical activity:    Days per week: Not on file    Minutes per session: Not on file  . Stress: Not on file  Relationships  . Social connections:    Talks on phone: Not on file    Gets together: Not on file    Attends religious service: Not on file    Active member of club or organization: Not on file    Attends meetings of clubs or organizations: Not on  file    Relationship status: Not on file  Other Topics Concern  . Not on file  Social History Narrative   ** Merged History Encounter **        No family history on file.  Health Maintenance  Topic Date Due  . TETANUS/TDAP  08/08/2005  . INFLUENZA VACCINE  11/07/2017  . HIV Screening  Completed    ----------------------------------------------------------------------------------------------------------------------------------------------------------------------------------------------------------------- Physical Exam BP 106/60 (BP Location: Left Arm, Patient Position: Sitting, Cuff Size: Normal)   Pulse 83   Temp 97.6 F (36.4 C) (Oral)   Resp 18   Ht 5\' 8"  (1.727 m)   Wt 149 lb (67.6 kg)   SpO2 98%   BMI 22.66 kg/m   Physical Exam Constitutional:      Appearance: Normal appearance.  HENT:     Head: Normocephalic and atraumatic.     Mouth/Throat:     Mouth: Mucous membranes are moist.    Cardiovascular:     Rate and Rhythm: Normal rate and regular rhythm.  Pulmonary:     Effort: Pulmonary effort is normal.     Breath sounds: Normal breath sounds.  Skin:    General: Skin is warm and dry.     Findings: No rash.  Neurological:     General: No focal deficit present.     Mental Status: He is alert.  Psychiatric:        Mood and Affect: Mood is anxious.        Speech: Speech normal.        Behavior: Behavior normal.        Judgment: Judgment normal.     ------------------------------------------------------------------------------------------------------------------------------------------------------------------------------------------------------------------- Assessment and Plan  MDD (major depressive disorder), recurrent episode, moderate (HCC) -Discussed treatment options and recommend starting lexapro. He and I both agree that he should stay away from any addictive or habit forming substances for management of his anxiety (ie. Benzodiazepines).  -Recommend continued therapy -Discussed plan to call 911 if having increasing suicidal ideation.  -I will see him back in 2 weeks

## 2018-06-25 ENCOUNTER — Ambulatory Visit (INDEPENDENT_AMBULATORY_CARE_PROVIDER_SITE_OTHER): Payer: Self-pay | Admitting: Family Medicine

## 2018-06-25 ENCOUNTER — Encounter: Payer: Self-pay | Admitting: Family Medicine

## 2018-06-25 ENCOUNTER — Other Ambulatory Visit: Payer: Self-pay

## 2018-06-25 VITALS — BP 100/68 | HR 98 | Temp 98.5°F | Ht 68.0 in | Wt 151.8 lb

## 2018-06-25 DIAGNOSIS — F331 Major depressive disorder, recurrent, moderate: Secondary | ICD-10-CM

## 2018-06-25 DIAGNOSIS — F411 Generalized anxiety disorder: Secondary | ICD-10-CM

## 2018-06-25 MED ORDER — ESCITALOPRAM OXALATE 20 MG PO TABS: 20.0000 mg | ORAL_TABLET | Freq: Every day | ORAL | 3 refills | Status: DC

## 2018-06-25 MED ORDER — HYDROXYZINE PAMOATE 25 MG PO CAPS: 25.0000 mg | ORAL_CAPSULE | Freq: Three times a day (TID) | ORAL | 3 refills | Status: DC | PRN

## 2018-06-25 NOTE — Progress Notes (Signed)
Justin Bass - 33 y.o. male MRN [id number]  Date of birth: Feb 28, 1986  Subjective Chief Complaint  Patient presents with   Follow-up    2 wk MDD , taking meds at directed, depression decreased, anxieties increased    HPI Justin Bass is a 33 y.o. male here today for f/u of MDD and GAD.  He was started on lexapro at previous appt.  He reports that depression has improved quite a bit since last visit.  He reports he is less short temper and not as easily annoyed by things.  His anxiety is about the same, maybe a little worse. He did have some improvement of anxiety initially.  He is also having some difficulty with falling asleep in the evening.  Once asleep he is able to fall asleep.  ROS:  A comprehensive ROS was completed and negative except as noted per HPI    No Known Allergies  Past Medical History:  Diagnosis Date   Allergy    Celiac disease    Celiac disease     No past surgical history on file.  Social History   Socioeconomic History   Marital status: Divorced    Spouse name: Not on file   Number of children: Not on file   Years of education: Not on file   Highest education level: Not on file  Occupational History   Not on file  Social Needs   Financial resource strain: Not on file   Food insecurity:    Worry: Not on file    Inability: Not on file   Transportation needs:    Medical: Not on file    Non-medical: Not on file  Tobacco Use   Smoking status: Current Some Day Smoker    Packs/day: 0.50    Types: Cigars   Smokeless tobacco: Never Used  Substance and Sexual Activity   Alcohol use: No    Frequency: Never   Drug use: Not Currently    Comment: heroin   Sexual activity: Not on file  Lifestyle   Physical activity:    Days per week: Not on file    Minutes per session: Not on file   Stress: Not on file  Relationships   Social connections:    Talks on phone: Not on file    Gets together: Not on file    Attends religious  service: Not on file    Active member of club or organization: Not on file    Attends meetings of clubs or organizations: Not on file    Relationship status: Not on file  Other Topics Concern   Not on file  Social History Narrative   ** Merged History Encounter **        No family history on file.  Health Maintenance  Topic Date Due   TETANUS/TDAP  08/08/2005   INFLUENZA VACCINE  11/07/2017   HIV Screening  Completed    ----------------------------------------------------------------------------------------------------------------------------------------------------------------------------------------------------------------- Physical Exam BP 100/68    Pulse 98    Temp 98.5 F (36.9 C) (Oral)    Ht 5\' 8"  (1.727 m)    Wt 151 lb 12.8 oz (68.9 kg)    SpO2 98%    BMI 23.08 kg/m   Physical Exam Constitutional:      Appearance: Normal appearance.  HENT:     Head: Normocephalic and atraumatic.  Eyes:     General: No scleral icterus. Neck:     Musculoskeletal: Neck supple.  Cardiovascular:     Rate and Rhythm:  Normal rate and regular rhythm.  Pulmonary:     Effort: Pulmonary effort is normal.     Breath sounds: Normal breath sounds.  Skin:    General: Skin is warm and dry.  Neurological:     General: No focal deficit present.     Mental Status: He is alert.  Psychiatric:        Mood and Affect: Mood normal.        Behavior: Behavior normal.     ------------------------------------------------------------------------------------------------------------------------------------------------------------------------------------------------------------------- Assessment and Plan  MDD (major depressive disorder), recurrent episode, moderate (HCC) Depressive symptoms improved however still with anxiety and insomnia.   -Increase lexapro to 20mg . -Will add on vistaril for anxiety and insomnia.  Return in about 3 months (around 09/25/2018) for MDD/Anxiety.

## 2018-06-25 NOTE — Assessment & Plan Note (Signed)
Depressive symptoms improved however still with anxiety and insomnia.   -Increase lexapro to 20mg . -Will add on vistaril for anxiety and insomnia.  Return in about 3 months (around 09/25/2018) for MDD/Anxiety.

## 2018-06-25 NOTE — Patient Instructions (Signed)
Great to see you Increase lexapro to 20mg  Start vistaril up to three times per day for anxiety See me again in 3 months, but send me a message through mychart in a few weeks and let me know how you are doing with new medication.

## 2018-07-02 ENCOUNTER — Other Ambulatory Visit: Payer: Self-pay

## 2018-07-02 ENCOUNTER — Ambulatory Visit (INDEPENDENT_AMBULATORY_CARE_PROVIDER_SITE_OTHER): Payer: HRSA Program | Admitting: Family Medicine

## 2018-07-02 ENCOUNTER — Ambulatory Visit: Payer: Self-pay

## 2018-07-02 ENCOUNTER — Encounter: Payer: Self-pay | Admitting: Family Medicine

## 2018-07-02 VITALS — HR 105 | Wt 151.0 lb

## 2018-07-02 DIAGNOSIS — Z20828 Contact with and (suspected) exposure to other viral communicable diseases: Secondary | ICD-10-CM | POA: Diagnosis not present

## 2018-07-02 DIAGNOSIS — Z20822 Contact with and (suspected) exposure to covid-19: Secondary | ICD-10-CM

## 2018-07-02 DIAGNOSIS — R6889 Other general symptoms and signs: Secondary | ICD-10-CM | POA: Diagnosis not present

## 2018-07-02 DIAGNOSIS — R69 Illness, unspecified: Secondary | ICD-10-CM | POA: Diagnosis not present

## 2018-07-02 DIAGNOSIS — J22 Unspecified acute lower respiratory infection: Secondary | ICD-10-CM

## 2018-07-02 DIAGNOSIS — J111 Influenza due to unidentified influenza virus with other respiratory manifestations: Secondary | ICD-10-CM | POA: Insufficient documentation

## 2018-07-02 MED ORDER — ALBUTEROL SULFATE HFA 108 (90 BASE) MCG/ACT IN AERS: 2.0000 | INHALATION_SPRAY | RESPIRATORY_TRACT | 1 refills | Status: AC | PRN

## 2018-07-02 NOTE — Progress Notes (Signed)
Virtual Visit via Video Note  I connected with@ on 07/02/18 at  2:00 PM EDT by a video enabled telemedicine application and verified that I am speaking with the correct person using two identifiers.  Location patient: home Location provider:work or home office Persons participating in the virtual visit: patient, provider  I discussed the limitations of evaluation and management by telemedicine and the availability of in person appointments. The patient expressed understanding and agreed to proceed.   HPI: Justin Bass is a 33 y.o. male with complaint of fever, cough, shortness of breath, chills and mild shortness of breath.  He also has mild headache and mild body aches.  Symptoms started about 3 days ago.  Reports traveling to [place], [place] about 7-10 days ago.  He wasn't around anyone with symptoms of COVID-19 that he was aware of.  He denies GI symptoms and is drinking plenty of fluids.  He is not having difficulty breathing.  He has noticed some wheezing and sob tends to be worse with cough.  He does have history of reactive airway component with previous respiratory illnesses.    ROS: See pertinent positives and negatives per HPI.  Past Medical History:  Diagnosis Date  . Allergy   . Celiac disease   . Celiac disease     No past surgical history on file.  No family history on file.    Current Outpatient Medications:  .  albuterol (PROVENTIL HFA;VENTOLIN HFA) 108 (90 Base) MCG/ACT inhaler, Inhale 2 puffs into the lungs every 4 (four) hours as needed for wheezing (cough, shortness of breath or wheezing.)., Disp: 1 Inhaler, Rfl: 1 .  escitalopram (LEXAPRO) 20 MG tablet, Take 1 tablet (20 mg total) by mouth daily., Disp: 30 tablet, Rfl: 3 .  hydrOXYzine (VISTARIL) 25 MG capsule, Take 1 capsule (25 mg total) by mouth 3 (three) times daily as needed for anxiety., Disp: 30 capsule, Rfl: 3  EXAM: VITALS per patient if applicable: Pulse (!) 105 Comment: Pt reported off if his  smartwatch .  Wt 151 lb (68.5 kg) Comment: Pt reports from home  BMI 22.96 kg/m    GENERAL: alert, oriented, appears ill but nontoxic.  Coughing without conversational dyspnea  HEENT: atraumatic, conjunttiva clear, no obvious abnormalities  NECK: normal movements of the head and neck  LUNGS: on inspection no signs of respiratory distress, breathing rate appears normal, no obvious gross SOB, gasping or wheezing  CV: no obvious cyanosis  MS: moves all visible extremities without noticeable abnormality  PSYCH/NEURO: pleasant and cooperative, no obvious depression or anxiety, speech and thought processing grossly intact  ASSESSMENT AND PLAN:  Discussed the following assessment and plan:  Suspected Covid-19 Virus Infection  Influenza-like illness - Plan: albuterol (PROVENTIL HFA;VENTOLIN HFA) 108 (90 Base) MCG/ACT inhaler Influenza-like illness -given recent travel to area with high incidence of COVID-19 and current symptoms I recommended that he self quarantine at home until fever/symptom free for at least 72 hours.  He may continue supportive care at home.  I have sent in a renewal of his albuterol inhaler.  I discussed with him that if his symptoms worsen including increased difficulty breathing he should see emergency care immediately.    He did also mention that his fiancee started having mild cough and chest tightness this morning and is currently at work.  She works in a lab and has direct patient care.  Discussed with him that she should also leave work, notify her employer and self quarantine.  I discussed the assessment and treatment plan with the patient. The patient was provided an opportunity to ask questions and all were answered. The patient agreed with the plan and demonstrated an understanding of the instructions.   The patient was advised to call back or seek an in-person evaluation if the symptoms worsen or if the condition fails to improve as anticipated.  I  provided 25 minutes of non-face-to-face time during this encounter.   Everrett Coombe, DO

## 2018-07-02 NOTE — Assessment & Plan Note (Signed)
-  given recent travel to area with high incidence of COVID-19 and current symptoms I recommended that he self quarantine at home until fever/symptom free for at least 72 hours.  He may continue supportive care at home.  I have sent in a renewal of his albuterol inhaler.  I discussed with him that if his symptoms worsen including increased difficulty breathing he should see emergency care immediately.    He did also mention that his fiancee started having mild cough and chest tightness this morning and is currently at work.  She works in a lab and has direct patient care.  Discussed with him that she should also leave work, notify her employer and self quarantine.

## 2018-07-02 NOTE — Telephone Encounter (Signed)
C/o onset dry cough, shortness of breath, intermittent wheeze, and intermittent hot and cold sensation x 2-3 days.  Attempted to check temperature, but it was subnormal.  Denied chest tightness, sinus congestion, runny nose, or sore throat.  Reported he recently took his children to [place], and dropped them off with grandparents, but did not stay, or come in contact with anyone, while there.  Stated he is unaware of any other poss. exposure to COVID 19.  Reported hx of bronchitis and pneumonia.     Called [facility]; spoke with Joni Reining.  Pt. Transferred to Joni Reining to discuss appt. Options.       Reason for Disposition . Wheezing is present    C/o intermittent wheeze, dry cough, shortness of breath, feeling hot and cold.  Answer Assessment - Initial Assessment Questions 1. ONSET: "When did the cough begin?"      2-3 days ago 2. SEVERITY: "How bad is the cough today?"      mild 3. RESPIRATORY DISTRESS: "Describe your breathing."      Some shortness of breath with laying down or doing activities  4. FEVER: "Do you have a fever?" If so, ask: "What is your temperature, how was it measured, and when did it start?"    Feels hot and cold  5. HEMOPTYSIS: "Are you coughing up any blood?" If so ask: "How much?" (flecks, streaks, tablespoons, etc.)     *No Answer* 6. TREATMENT: "What have you done so far to treat the cough?" (e.g., meds, fluids, humidifier)     Has not tried anything so far 7. CARDIAC HISTORY: "Do you have any history of heart disease?" (e.g., heart attack, congestive heart failure)      Denied  8. LUNG HISTORY: "Do you have any history of lung disease?"  (e.g., pulmonary embolus, asthma, emphysema)     Hx of bronchitis and pneumonia; denied asthma or COPD  9. PE RISK FACTORS: "Do you have a history of blood clots?" (or: recent major surgery, recent prolonged travel, bedridden)     *No Answer* 10. OTHER SYMPTOMS: "Do you have any other symptoms? (e.g., runny nose, wheezing, chest pain)   Intermittent wheezing, shortness of breath, dry cough;  denied chest tightness, denied sinus congestion or runny nose; denied sore throat   11. PREGNANCY: "Is there any chance you are pregnant?" "When was your last menstrual period?"       N/a  12. TRAVEL: "Have you traveled out of the country in the last month?" (e.g., travel history, exposures)       Recent trip to [place] to drop children off at grandparents home  Protocols used: COUGH - ACUTE NON-PRODUCTIVE-A-AH

## 2018-07-03 ENCOUNTER — Other Ambulatory Visit: Payer: Self-pay | Admitting: Family Medicine

## 2018-07-03 MED ORDER — ESCITALOPRAM OXALATE 20 MG PO TABS: 20.0000 mg | ORAL_TABLET | Freq: Every day | ORAL | 3 refills | Status: DC

## 2018-07-03 MED ORDER — HYDROXYZINE PAMOATE 25 MG PO CAPS: 25.0000 mg | ORAL_CAPSULE | Freq: Three times a day (TID) | ORAL | 3 refills | Status: AC | PRN

## 2018-07-10 NOTE — Progress Notes (Signed)
PT the letter from 07/02/2018,needed work note resent . Completed. Printed and will mail hard copy.

## 2018-07-15 ENCOUNTER — Encounter: Payer: Self-pay | Admitting: Family Medicine

## 2018-07-16 NOTE — Telephone Encounter (Signed)
I do not see these results. Where did he have testing done?

## 2018-07-21 ENCOUNTER — Encounter: Payer: Self-pay | Admitting: Family Medicine

## 2018-07-21 ENCOUNTER — Ambulatory Visit (INDEPENDENT_AMBULATORY_CARE_PROVIDER_SITE_OTHER): Payer: Self-pay | Admitting: Family Medicine

## 2018-07-21 ENCOUNTER — Other Ambulatory Visit: Payer: Self-pay | Admitting: Family Medicine

## 2018-07-21 DIAGNOSIS — F331 Major depressive disorder, recurrent, moderate: Secondary | ICD-10-CM

## 2018-07-21 MED ORDER — SERTRALINE HCL 50 MG PO TABS: 50.0000 mg | ORAL_TABLET | Freq: Every day | ORAL | 3 refills | Status: DC

## 2018-07-21 MED ORDER — FLUOXETINE HCL 20 MG PO TABS: 20.0000 mg | ORAL_TABLET | Freq: Every day | ORAL | 3 refills | Status: DC

## 2018-07-21 NOTE — Assessment & Plan Note (Signed)
-  Discussed changing lexapro to either sertraline or fluoxetine.  He will check prices and let me know which one he would like to try.  -If still has sexual side effects we can try addition of bupropion as well.  -I will f/u with him in about 3-4 weeks.

## 2018-07-21 NOTE — Progress Notes (Signed)
Justin CarlsDavid Robert Bass - 33 y.o. male MRN [id number]  Date of birth: 06/29/1985   This visit type was conducted due to national recommendations for restrictions regarding the COVID-19 Pandemic (e.g. social distancing).  This format is felt to be most appropriate for this patient at this time.  All issues noted in this document were discussed and addressed.  No physical exam was performed (except for noted visual exam findings with Video Visits).  I discussed the limitations of evaluation and management by telemedicine and the availability of in person appointments. The patient expressed understanding and agreed to proceed.  I connected with@ on 07/21/18 at  2:00 PM EDT by a video enabled telemedicine application and verified that I am speaking with the correct person using two identifiers.   Patient Location[place] [address] [place] [id number]   [facility]  Chief Complaint  Patient presents with  . Depression    Meds not working    HPI  Justin Bass is a 33 y.o. male who presents via audio/video conferencing for a telehealth visit today. He is following up today for depression and anxiety.  He is currently treated with lexapro and vistaril.  He has had relief of depressive symptoms with lexapro however reports that it seems to be working too well.  He feels like he is unable to express any kind of emotion now and is having sexual side effects including anorgasmia.  He would like to switch medications to see if something else works better for him.  He denies SI at this time.  He is feeling better from recent febrile illness.    ROS:  A comprehensive ROS was completed and negative except as noted per HPI  Past Medical History:  Diagnosis Date  . Allergy   . Celiac disease   . Celiac disease     No past surgical history on file.  No family history on file.  Social History   Socioeconomic History  . Marital status: Divorced    Spouse name:  Not on file  . Number of children: Not on file  . Years of education: Not on file  . Highest education level: Not on file  Occupational History  . Not on file  Social Needs  . Financial resource strain: Not on file  . Food insecurity:    Worry: Not on file    Inability: Not on file  . Transportation needs:    Medical: Not on file    Non-medical: Not on file  Tobacco Use  . Smoking status: Current Some Day Smoker    Packs/day: 0.50    Types: Cigars  . Smokeless tobacco: Never Used  Substance and Sexual Activity  . Alcohol use: No    Frequency: Never  . 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 Sexual activity: Not on file  Lifestyle  . Physical activity:    Days per week: Not on file    Minutes per session: Not on file  . Stress: Not on file  Relationships  . Social connections:    Talks on phone: Not on file    Gets together: Not on file    Attends religious service: Not on file    Active member of club or organization: Not on file    Attends meetings of clubs or organizations: Not on file    Relationship status: Not on file  . Intimate partner violence:    Fear of current  or ex partner: Not on file    Emotionally abused: Not on file    Physically abused: Not on file    Forced sexual activity: Not on file  Other Topics Concern  . Not on file  Social History Narrative   ** Merged History Encounter **         Current Outpatient Medications:  .  albuterol (PROVENTIL HFA;VENTOLIN HFA) 108 (90 Base) MCG/ACT inhaler, Inhale 2 puffs into the lungs every 4 (four) hours as needed for wheezing (cough, shortness of breath or wheezing.)., Disp: 1 Inhaler, Rfl: 1 .  hydrOXYzine (VISTARIL) 25 MG capsule, Take 1 capsule (25 mg total) by mouth 3 (three) times daily as needed for anxiety., Disp: 30 capsule, Rfl: 3  EXAM:  VITALS per patient if applicable: Pulse 94 Comment: pt was able to use smart watch  Temp 97.8 F (36.6 C) (Oral) Comment: pt reports  Wt 158 lb (71.7  kg) Comment: pt reports from hm  BMI 24.02 kg/m   GENERAL: alert, oriented, appears well and in no acute distress  HEENT: atraumatic, conjunttiva clear, no obvious abnormalities on inspection of external nose and ears  NECK: normal movements of the head and neck  LUNGS: on inspection no signs of respiratory distress, breathing rate appears normal, no obvious gross SOB  CV: no obvious cyanosis  MS: moves all visible extremities without noticeable abnormality  PSYCH/NEURO: pleasant and cooperative, no obvious depression or anxiety, speech and thought processing grossly intact  ASSESSMENT AND PLAN:  Discussed the following assessment and plan:  MDD (major depressive disorder), recurrent episode, moderate (HCC) -Discussed changing lexapro to either sertraline or fluoxetine.  He will check prices and let me know which one he would like to try.  -If still has sexual side effects we can try addition of bupropion as well.  -I will f/u with him in about 3-4 weeks.        I discussed the assessment and treatment plan with the patient. The patient was provided an opportunity to ask questions and all were answered. The patient agreed with the plan and demonstrated an understanding of the instructions.   The patient was advised to call back or seek an in-person evaluation if the symptoms worsen or if the condition fails to improve as anticipated.   Everrett Coombe, DO

## 2018-07-21 NOTE — Addendum Note (Signed)
Addended by: Mammie Lorenzo on: 07/21/2018 02:44 PM   Modules accepted: Orders

## 2018-08-01 ENCOUNTER — Encounter: Payer: Self-pay | Admitting: Family Medicine

## 2018-08-04 NOTE — Telephone Encounter (Signed)
I don't see that I have written this for him before.  Please have him schedule virtual visit.  We can schedule mychart video visit if he would like.

## 2018-09-07 ENCOUNTER — Emergency Department (HOSPITAL_BASED_OUTPATIENT_CLINIC_OR_DEPARTMENT_OTHER)
Admission: EM | Admit: 2018-09-07 | Discharge: 2018-09-07 | Disposition: A | Discharge status: Home / Self Care | Payer: Self-pay | Attending: Emergency Medicine | Admitting: Emergency Medicine

## 2018-09-07 ENCOUNTER — Encounter (HOSPITAL_BASED_OUTPATIENT_CLINIC_OR_DEPARTMENT_OTHER): Payer: Self-pay | Admitting: *Deleted

## 2018-09-07 ENCOUNTER — Other Ambulatory Visit: Payer: Self-pay

## 2018-09-07 DIAGNOSIS — Z79899 Other long term (current) drug therapy: Secondary | ICD-10-CM | POA: Insufficient documentation

## 2018-09-07 DIAGNOSIS — F17210 Nicotine dependence, cigarettes, uncomplicated: Secondary | ICD-10-CM | POA: Insufficient documentation

## 2018-09-07 DIAGNOSIS — Z77098 Contact with and (suspected) exposure to other hazardous, chiefly nonmedicinal, chemicals: Secondary | ICD-10-CM | POA: Insufficient documentation

## 2018-09-07 DIAGNOSIS — H579 Unspecified disorder of eye and adnexa: Secondary | ICD-10-CM | POA: Insufficient documentation

## 2018-09-07 NOTE — ED Notes (Signed)
Pt discharged by primary RN.

## 2018-09-07 NOTE — ED Notes (Signed)
ED Provider at bedside. 
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
## 2018-09-07 NOTE — ED Notes (Signed)
Morgan lenses applied to bilat eyes- normal saline rinsing. Pt tolerating well. EDP at bedside.

## 2018-09-07 NOTE — ED Notes (Signed)
Pt continues to tolerate eye flushing

## 2018-09-25 ENCOUNTER — Ambulatory Visit: Payer: Self-pay | Admitting: Family Medicine

## 2018-11-12 ENCOUNTER — Emergency Department (HOSPITAL_BASED_OUTPATIENT_CLINIC_OR_DEPARTMENT_OTHER): Payer: Self-pay

## 2018-11-12 ENCOUNTER — Telehealth: Payer: Self-pay | Admitting: Interventional Cardiology

## 2018-11-12 ENCOUNTER — Emergency Department (HOSPITAL_BASED_OUTPATIENT_CLINIC_OR_DEPARTMENT_OTHER)
Admission: EM | Admit: 2018-11-12 | Discharge: 2018-11-12 | Disposition: A | Discharge status: Home / Self Care | Payer: Self-pay | Attending: Emergency Medicine | Admitting: Emergency Medicine

## 2018-11-12 ENCOUNTER — Encounter (HOSPITAL_BASED_OUTPATIENT_CLINIC_OR_DEPARTMENT_OTHER): Payer: Self-pay

## 2018-11-12 ENCOUNTER — Other Ambulatory Visit: Payer: Self-pay

## 2018-11-12 DIAGNOSIS — Z79899 Other long term (current) drug therapy: Secondary | ICD-10-CM | POA: Insufficient documentation

## 2018-11-12 DIAGNOSIS — F17210 Nicotine dependence, cigarettes, uncomplicated: Secondary | ICD-10-CM | POA: Insufficient documentation

## 2018-11-12 DIAGNOSIS — R079 Chest pain, unspecified: Secondary | ICD-10-CM

## 2018-11-12 DIAGNOSIS — R42 Dizziness and giddiness: Secondary | ICD-10-CM | POA: Insufficient documentation

## 2018-11-12 DIAGNOSIS — R0602 Shortness of breath: Secondary | ICD-10-CM | POA: Insufficient documentation

## 2018-11-12 DIAGNOSIS — R61 Generalized hyperhidrosis: Secondary | ICD-10-CM | POA: Insufficient documentation

## 2018-11-12 DIAGNOSIS — Z20828 Contact with and (suspected) exposure to other viral communicable diseases: Secondary | ICD-10-CM | POA: Insufficient documentation

## 2018-11-12 DIAGNOSIS — R110 Nausea: Secondary | ICD-10-CM | POA: Insufficient documentation

## 2018-11-12 LAB — CBC
HCT: 46.9 % (ref 39.0–52.0)
Hemoglobin: 16 g/dL (ref 13.0–17.0)
MCH: 30.5 pg (ref 26.0–34.0)
MCHC: 34.1 g/dL (ref 30.0–36.0)
MCV: 89.3 fL (ref 80.0–100.0)
Platelets: 180 10*3/uL (ref 150–400)
RBC: 5.25 MIL/uL (ref 4.22–5.81)
RDW: 12.7 % (ref 11.5–15.5)
WBC: 9.1 10*3/uL (ref 4.0–10.5)
nRBC: 0 % (ref 0.0–0.2)

## 2018-11-12 LAB — BASIC METABOLIC PANEL
Anion gap: 11 (ref 5–15)
BUN: 9 mg/dL (ref 6–20)
CO2: 23 mmol/L (ref 22–32)
Calcium: 9.2 mg/dL (ref 8.9–10.3)
Chloride: 104 mmol/L (ref 98–111)
Creatinine, Ser: 0.97 mg/dL (ref 0.61–1.24)
GFR calc Af Amer: >60 mL/min (ref >60)
GFR calc non Af Amer: >60 mL/min (ref >60)
Glucose, Bld: 148 mg/dL — ABNORMAL HIGH (ref 70–99)
Potassium: 3.7 mmol/L (ref 3.5–5.1)
Sodium: 138 mmol/L (ref 135–145)

## 2018-11-12 LAB — TROPONIN I (HIGH SENSITIVITY)
Troponin I (High Sensitivity): <2 ng/L (ref <18)
Troponin I (High Sensitivity): <2 ng/L (ref <18)

## 2018-11-12 LAB — SARS CORONAVIRUS 2 BY RT PCR (HOSPITAL ORDER, PERFORMED IN ~~LOC~~ HOSPITAL LAB): SARS Coronavirus 2: NEGATIVE

## 2018-11-12 IMAGING — CR CHEST - 2 VIEW
2 series · 2 of 2 positions shown · non-contrast
Comparison: [DATE]

CLINICAL DATA: Chest pain

EXAM:
CHEST - 2 VIEW

[w chest pa]
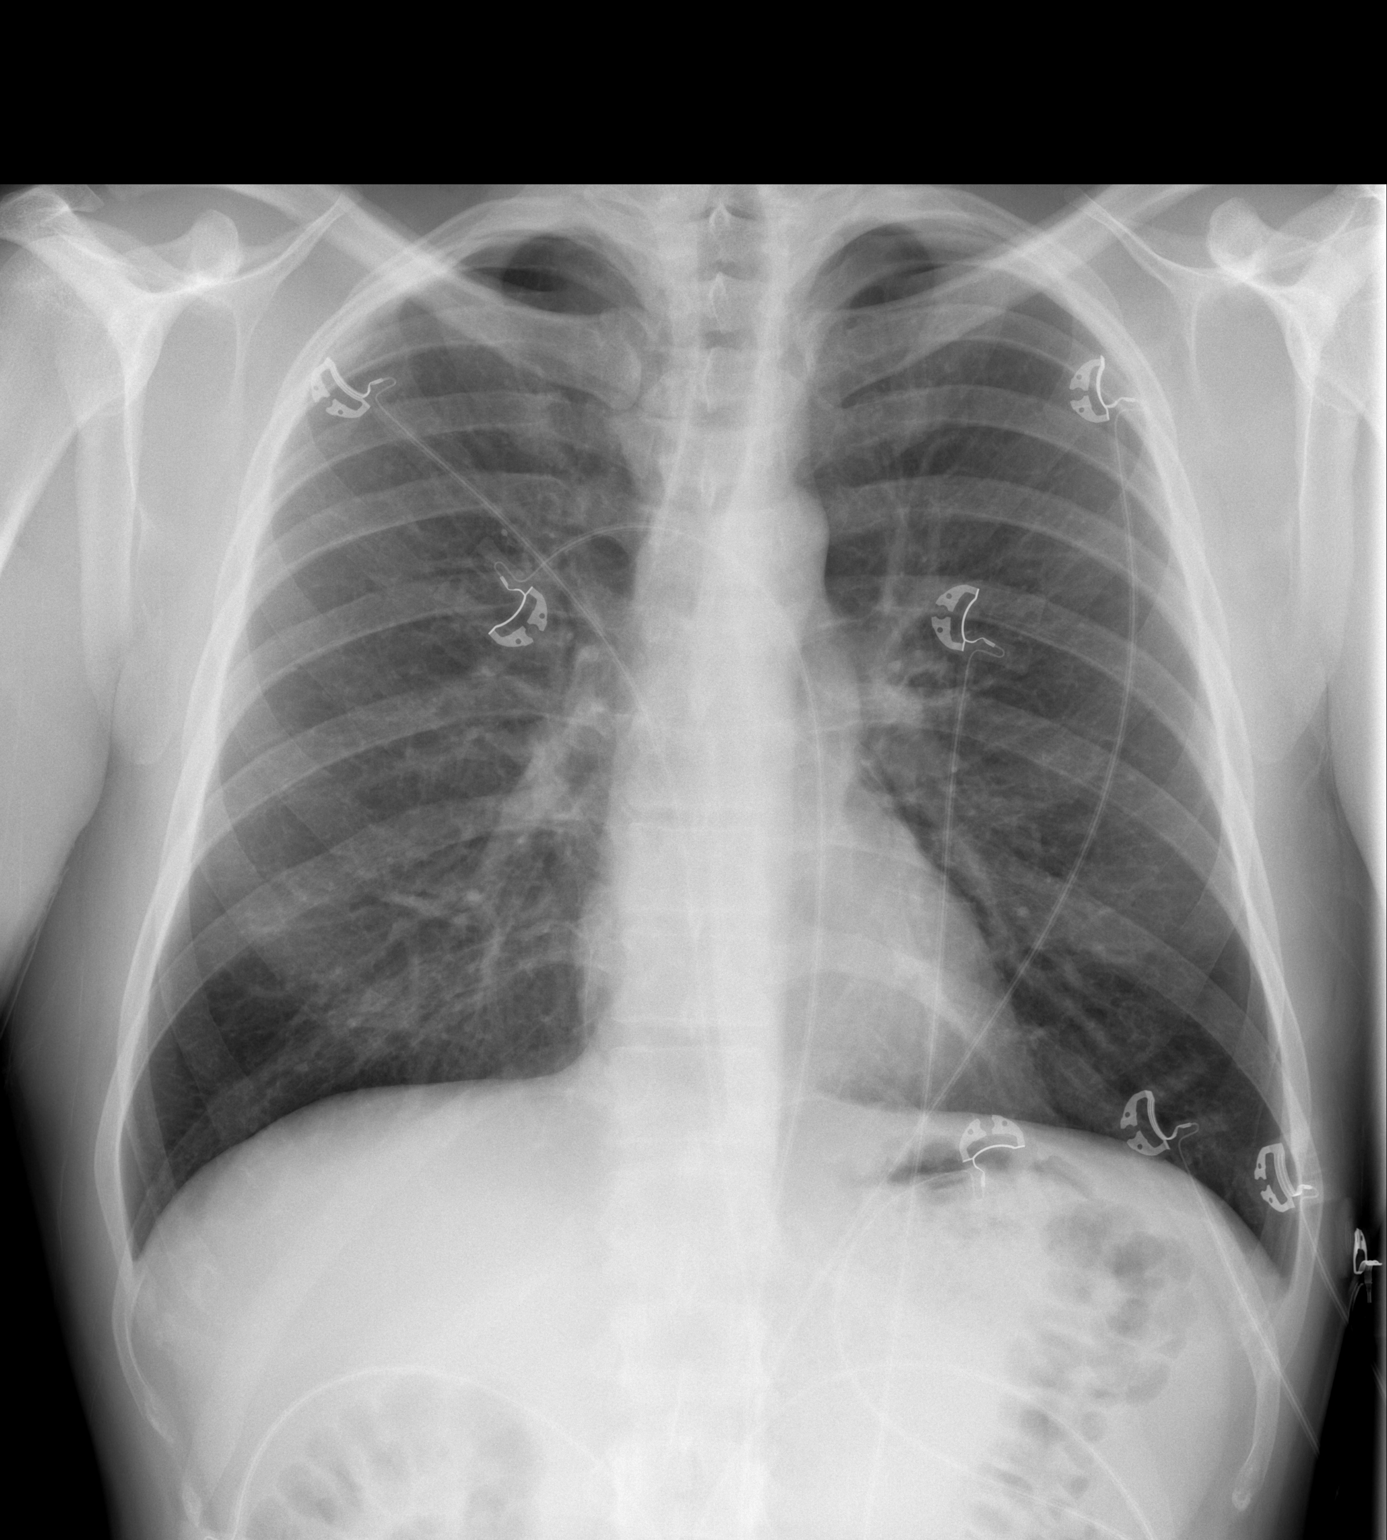

[w chest lat]
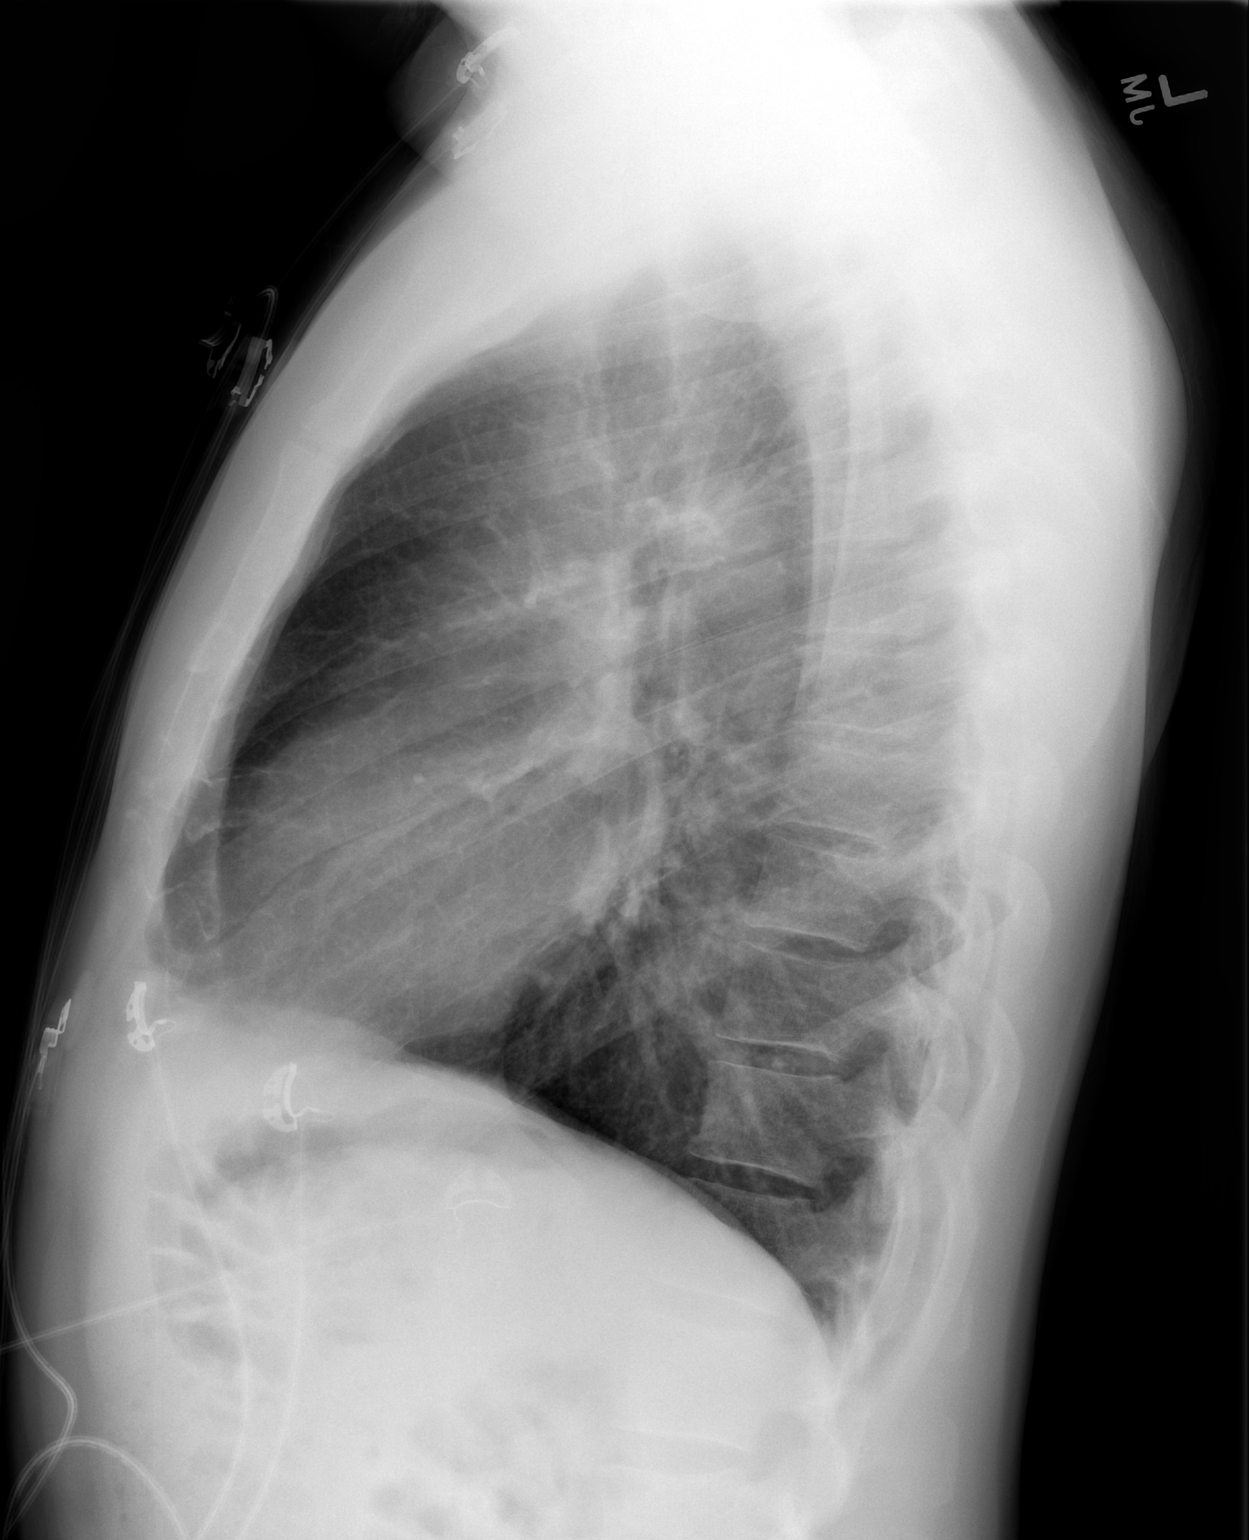

[2 of 2 positions shown; findings below may reference images not displayed]

FINDINGS: The heart size and mediastinal contours are within normal limits.
Both lungs are clear. The visualized skeletal structures are
unremarkable.
IMPRESSION: No active cardiopulmonary disease.

## 2018-11-12 MED ORDER — SODIUM CHLORIDE 0.9% FLUSH
3.0000 mL | Freq: Once | INTRAVENOUS | Status: DC
  Filled 2018-11-12: qty 3

## 2018-11-12 NOTE — ED Notes (Signed)
ED Provider at bedside. 

## 2018-11-12 NOTE — Progress Notes (Signed)
[facility] ED TOC CM -referral no insurance Chart reviewed and no insurance. Pt does have PCP, Dr Zigmund Daniel and last appt was in 07/2018.  Jonnie Finner RN CCM [phone]

## 2018-11-12 NOTE — ED Triage Notes (Signed)
Pt c/o CP x today-NAD-steady gait 

## 2018-11-12 NOTE — Discharge Instructions (Addendum)
As we discussed, you have been tested for COVID.  You can check online regarding the results.  You should quarantine yourself for the next 2 days which means self isolating and not having any contact with anyone.  Cardiologist office will plan to follow-up with you and get a stress test done on Friday.  They are going to call you tomorrow for an appointment time.  If you have not heard from them, please call their office.  Return to the Emergency Department immediately if you experiencing worsening chest pain, difficulty breathing, nausea/vomiting, get very sweaty, headache or any other worsening or concerning symptoms.

## 2018-11-12 NOTE — ED Notes (Signed)
Patient transported to X-ray 

## 2018-11-12 NOTE — ED Provider Notes (Signed)
[facility] EMERGENCY DEPARTMENT Provider Note   CSN: [id number] Arrival date & time: 11/12/18  1412    History   Chief Complaint Chief Complaint  Patient presents with  . Chest Pain    HPI Justin Bass is a 33 y.o. male past medical history of celiac disease who presents for evaluation of chest pain he reports that about 10 AM this morning, he was driving when his chest pain began.  He describes it as a pressure "like somebody was sitting on my chest."  He states that the pain radiated to his left upper extremity.  He reported associated dizziness, nausea, shortness of breath.  He states that he did not recall if the pain was worse with exertion or deep inspiration.  He states it lasted about 10 minutes and resolved on its own.  He did not take any medications.  He went home to rest.  He reports about an hour prior to ED arrival, he had an episode of pain again that was similar but not as severe.  He states it lasted for about 10 minutes before resolving on its own.  He came to the emergency department for further evaluation.  Currently chest pain-free at this time.  Patient states he has no personal cardiac history.  He does report significant family CAD in mom, dad, uncles.  He states he did have a grandfather had a heart attack in his late 8330s or early 5640s.  He is a current smoker and smokes about half a pack a day.  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  He has no personal history of hypertension, diabetes. He denies any exogenous hormone use, recent immobilization, prior history of DVT/PE, recent surgery, leg swelling, or long travel.  He denies any preceding trauma, injury, fall.  Denies any recent heavy lifting.  Denies any recent sicknesses, fevers, chills, cough.  No recent travel, known COVID-19 exposure.  HPI: A 33 year old patient presents for evaluation of chest pain. Initial onset of pain was  approximately 1-3 hours ago. The patient's chest pain is described as heaviness/pressure/tightness and is not worse with exertion. The patient complains of nausea and reports some diaphoresis. The patient's chest pain is middle- or left-sided, is not well-localized, is not sharp and does radiate to the arms/jaw/neck. The patient has smoked in the past 90 days and has a family history of coronary artery disease in a first-degree relative with onset less than age 32. The patient has no history of stroke, has no history of peripheral artery disease, denies any history of treated diabetes, is not hypertensive, has no history of hypercholesterolemia and does not have an elevated BMI (>=30).   The history is provided by the patient.    Past Medical History:  Diagnosis Date  . Allergy   . Celiac disease   . Celiac disease     Patient Active Problem List   Diagnosis Date Noted  . Influenza-like illness 07/02/2018  . MDD (major depressive disorder), recurrent episode, moderate (HCC) 06/10/2018  . GAD (generalized anxiety disorder) 06/10/2018  . Allergy 03/12/2013  . Asthma with acute exacerbation 03/12/2013    History reviewed. No pertinent surgical history.      Home Medications    Prior to Admission medications   Medication Sig Start Date End Date Taking? Authorizing Provider  albuterol (PROVENTIL HFA;VENTOLIN HFA) 108 (90 Base) MCG/ACT inhaler Inhale 2 puffs into the lungs  every 4 (four) hours as needed for wheezing (cough, shortness of breath or wheezing.). 07/02/18   Everrett CoombeMatthews, Cody, DO  FLUoxetine (PROZAC) 20 MG capsule TAKE 1 CAPSULE BY MOUTH ONCE DAILY 07/22/18   Everrett CoombeMatthews, Cody, DO  hydrOXYzine (VISTARIL) 25 MG capsule Take 1 capsule (25 mg total) by mouth 3 (three) times daily as needed for anxiety. 07/03/18   Everrett CoombeMatthews, Cody, DO    Family History No family history on file.  Social History Social History   Tobacco Use  . Smoking status: Current Some Day Smoker    Packs/day: 0.50     Types: Cigarettes  . Smokeless tobacco: Never Used  Substance Use Topics  . Alcohol use: Yes    Frequency: Never    Comment: occ  . Drug use: Not Currently    Types: Cocaine, IV     Allergies   Other   Review of Systems Review of Systems  Constitutional: Positive for diaphoresis. Negative for fever.  Respiratory: Positive for shortness of breath. Negative for cough and chest tightness.   Cardiovascular: Positive for chest pain.  Gastrointestinal: Positive for nausea. Negative for abdominal pain and vomiting.  Genitourinary: Negative for dysuria and hematuria.  Neurological: Negative for headaches.  All other systems reviewed and are negative.    Physical Exam Updated Vital Signs BP 122/79   Pulse 71   Temp 98.2 F (36.8 C) (Oral)   Resp 16   Ht 5\' 8"  (1.727 m)   Wt 70.4 kg   SpO2 100%   BMI 23.60 kg/m   Physical Exam Vitals signs and nursing note reviewed.  Constitutional:      Appearance: Normal appearance. He is well-developed.  HENT:     Head: Normocephalic and atraumatic.  Eyes:     General: Lids are normal.     Conjunctiva/sclera: Conjunctivae normal.     Pupils: Pupils are equal, round, and reactive to light.  Neck:     Musculoskeletal: Full passive range of motion without pain.  Cardiovascular:     Rate and Rhythm: Normal rate and regular rhythm.     Pulses: Normal pulses.          Radial pulses are 2+ on the right side and 2+ on the left side.       Dorsalis pedis pulses are 2+ on the right side and 2+ on the left side.     Heart sounds: Normal heart sounds. No murmur. No friction rub. No gallop.   Pulmonary:     Effort: Pulmonary effort is normal.     Breath sounds: Normal breath sounds.     Comments: Lungs clear to auscultation bilaterally.  Symmetric chest rise.  No wheezing, rales, rhonchi. Abdominal:     Palpations: Abdomen is soft. Abdomen is not rigid.     Tenderness: There is no abdominal tenderness. There is no guarding.   Musculoskeletal: Normal range of motion.  Skin:    General: Skin is warm and dry.     Capillary Refill: Capillary refill takes less than 2 seconds.  Neurological:     Mental Status: He is alert and oriented to person, place, and time.  Psychiatric:        Speech: Speech normal.      ED Treatments / Results  Labs (all labs ordered are listed, but only abnormal results are displayed) Labs Reviewed  BASIC METABOLIC PANEL - Abnormal; Notable for the following components:      Result Value   Glucose, Bld 148 (*)  All other components within normal limits  SARS CORONAVIRUS 2 (HOSPITAL ORDER, [facility])  CBC  TROPONIN I (HIGH SENSITIVITY)  TROPONIN I (HIGH SENSITIVITY)    EKG EKG Interpretation  Date/Time:  Wednesday November 12 2018 14:51:28 EDT Ventricular Rate:  95 PR Interval:    QRS Duration: 116 QT Interval:  342 QTC Calculation: 430 R Axis:   105 Text Interpretation:  Sinus rhythm IRBBB and LPFB Probable anteroseptal infarct, old No significant change since last tracing Confirmed by Gareth Morgan [phone]) on 11/12/2018 2:55:36 PM  EKG Interpretation  Date/Time:  Wednesday November 12 2018 16:54:18 EDT Ventricular Rate:  72 PR Interval:    QRS Duration: 116 QT Interval:  377 QTC Calculation: 413 R Axis:   100 Text Interpretation:  Sinus rhythm IRBBB and LPFB Anteroseptal infarct, old No significant change since last tracing Confirmed by Blanchie Dessert [phone]) on 11/12/2018 5:56:30 PM    Radiology Dg Chest 2 View  Result Date: 11/12/2018 CLINICAL DATA:  Chest pain EXAM: CHEST - 2 VIEW COMPARISON:  02/02/2013 FINDINGS: The heart size and mediastinal contours are within normal limits. Both lungs are clear. The visualized skeletal structures are unremarkable. IMPRESSION: No active cardiopulmonary disease. Electronically Signed   By: Donavan Foil M.D.   On: 11/12/2018 14:51    Procedures Procedures (including critical care time)   Medications Ordered in ED Medications - No data to display   Initial Impression / Assessment and Plan / ED Course  I have reviewed the triage vital signs and the nursing notes.  Pertinent labs & imaging results that were available during my care of the patient were reviewed by me and considered in my medical decision making (see chart for details).     HEAR Score: 78  33 year old male who presents for evaluation of chest pain.  He had an episode at 10 AM and about an hour ago.  No chest pain at this time.  Associated diaphoresis, nausea, shortness of breath.  Reports history of smoking and significant family cardiac history.  No personal cardiac history, hypertension, diabetes. Patient is afebrile, non-toxic appearing, sitting comfortably on examination table. Vital signs reviewed and stable.  Concern for ACS etiology versus infectious process.  Plan to check labs.  BMP is unremarkable.  CBC without significant leukocytosis or anemia.  Lance Bosch is within normal limits. Negative for any acute infectious etiology.  Given his history/risk factors, he has a heart score of 4.  Given his EKG findings, will plan to discuss with cardiology.  Discussed patient with Dr. Cristina Gong (Cardiology).  He does not feel that his EKG looks ischemic.  He does have significant risk factors and given his EKG abnormalities feels like he needs close follow-up.  If delta troponin is negative and patient is asymptomatic here in the ED, can resume be discharged home with plans for outpatient follow-up and stress test in the next 24-48 hours. Discussed patient with Dr. Billy Fischer who is agreeable to plan.  Delta troponin is negative.  Discussed results with patient.  He states he has not had any more chest pain since being here in the ED.  Discussed patient with Dr. Cristina Gong (Cards) regarding second troponin.  He will plan to have patient stress test this Friday.  He requested a COVID test be done today and that patient  quarantine until Friday.  Updated patient on plan.  He is agreeable.  Instructed patient to follow-up as directed on Friday.  Strict patient to quarantine. At this time, patient  exhibits no emergent life-threatening condition that require further evaluation in the ED or admission. Patient had ample opportunity for questions and discussion. All patient's questions were answered with full understanding. Strict return precautions discussed. Patient expresses understanding and agreement to plan.   Portions of this note were generated with Scientist, clinical (histocompatibility and immunogenetics)Dragon dictation software. Dictation errors may occur despite best attempts at proofreading.   Final Clinical Impressions(s) / ED Diagnoses   Final diagnoses:  Chest pain, unspecified type    ED Discharge Orders    None       Rosana HoesLayden, Shavon Ashmore A, PA-C 11/12/18 2221    Alvira MondaySchlossman, Erin, MD 11/15/18 1143

## 2018-11-12 NOTE — ED Notes (Signed)
[facility] phoned for consult to cardiology, Justin Bass will page.

## 2018-11-12 NOTE — ED Notes (Signed)
Pt reports this AM- pain to L side of chest with dizziness. The pain subsided for a short period and then returned and has felt like a nagging sharp pain. Pt denies SOB. Pt does report smoking hx.

## 2018-11-12 NOTE — Telephone Encounter (Signed)
Spoke with Otho Perl and Dr. Irish Lack regarding this patient have exercise nuclear stress test done on Friday. Patient is currently at [facility] and they will perform rapid COVID test before the patient is discharged. We will be able to see these results. Patient will be instructed to quarantine once he is discharged until his stress test appt. Shirlean Mylar is going to look at schedule in the morning to arrange appt at either the [facility] or [facility] location and I will call the patient and make aware.

## 2018-11-13 ENCOUNTER — Telehealth (HOSPITAL_COMMUNITY): Payer: Self-pay

## 2018-11-13 NOTE — Telephone Encounter (Signed)
Follow up  ° ° °Pt is returning call  ° ° °Please call back  °

## 2018-11-13 NOTE — Telephone Encounter (Signed)
Patient agrees to Exercise Myoview tomorrow at [facility] at 7:45 AM. Instructions reviewed with the patient (see letter tab). He understands to arrive 15 minutes prior to appt. Negative COVID test in Epic and patient will quarantine until his appt.   Justin Bass will schedule the patient.

## 2018-11-13 NOTE — Telephone Encounter (Signed)
Encounter complete. 

## 2018-11-13 NOTE — Telephone Encounter (Signed)
Spoke with Shirlean Mylar and they will be able to perform stress test at [facility] tomorrow at 7:45 AM. I will call and confirm with patient.   Called to confirm with patient and he asked that I call him back in 10-15 minutes.

## 2018-11-14 ENCOUNTER — Other Ambulatory Visit: Payer: Self-pay

## 2018-11-14 ENCOUNTER — Ambulatory Visit (HOSPITAL_COMMUNITY)
Admission: RE | Admit: 2018-11-14 | Discharge: 2018-11-14 | Disposition: A | Discharge status: Home / Self Care | Payer: Self-pay | Source: Ambulatory Visit | Attending: Cardiovascular Disease | Admitting: Cardiovascular Disease

## 2018-11-14 DIAGNOSIS — R079 Chest pain, unspecified: Secondary | ICD-10-CM | POA: Insufficient documentation

## 2018-11-14 LAB — MYOCARDIAL PERFUSION IMAGING
Estimated workload: 17.2 METS
Exercise duration (min): 15 min
Exercise duration (sec): 0 s
LV dias vol: 141 mL (ref 62–150)
LV sys vol: 70 mL
MPHR: 188 {beats}/min
Peak HR: 173 {beats}/min
Percent HR: 92 %
Rest HR: 58 {beats}/min
SDS: 2
SRS: 0
SSS: 2
TID: 0.98

## 2018-11-14 MED ORDER — TECHNETIUM TC 99M TETROFOSMIN IV KIT
27.9000 | PACK | Freq: Once | INTRAVENOUS | Status: AC | PRN
  Administered 2018-11-14: 27.9 via INTRAVENOUS
  Filled 2018-11-14: qty 28

## 2018-11-14 MED ORDER — TECHNETIUM TC 99M TETROFOSMIN IV KIT
10.4000 | PACK | Freq: Once | INTRAVENOUS | Status: AC | PRN
  Administered 2018-11-14: 10.4 via INTRAVENOUS
  Filled 2018-11-14: qty 11

## 2018-11-17 ENCOUNTER — Telehealth: Payer: Self-pay

## 2018-11-17 NOTE — Telephone Encounter (Signed)
The patient has been notified of the result and verbalized understanding.  All questions (if any) were answered. Recall placed for 1 yr. Cleon Gustin, RN 11/17/2018 11:15 AM

## 2018-11-17 NOTE — Telephone Encounter (Signed)
NOTES ON FILE FROM ED SENT REFERRAL TO SCHEDULING 

## 2018-11-17 NOTE — Telephone Encounter (Signed)
-----   Message from Jettie Booze, MD sent at 11/16/2018 10:55 AM EDT ----- Normal stress test. 1 year f/u given family h/o CAD, or sooner if sx persist.  He needs to avoid tobacco.

## 2019-01-16 NOTE — Telephone Encounter (Signed)
Encounter complete. 

## 2019-01-31 IMAGING — CR DG FINGER RING 2+V*L*
3 series · 3 of 3 positions shown · non-contrast
Comparison: None.

CLINICAL DATA: Status post trauma.

EXAM:
LEFT RING FINGER 2+V

[x finger pa left]
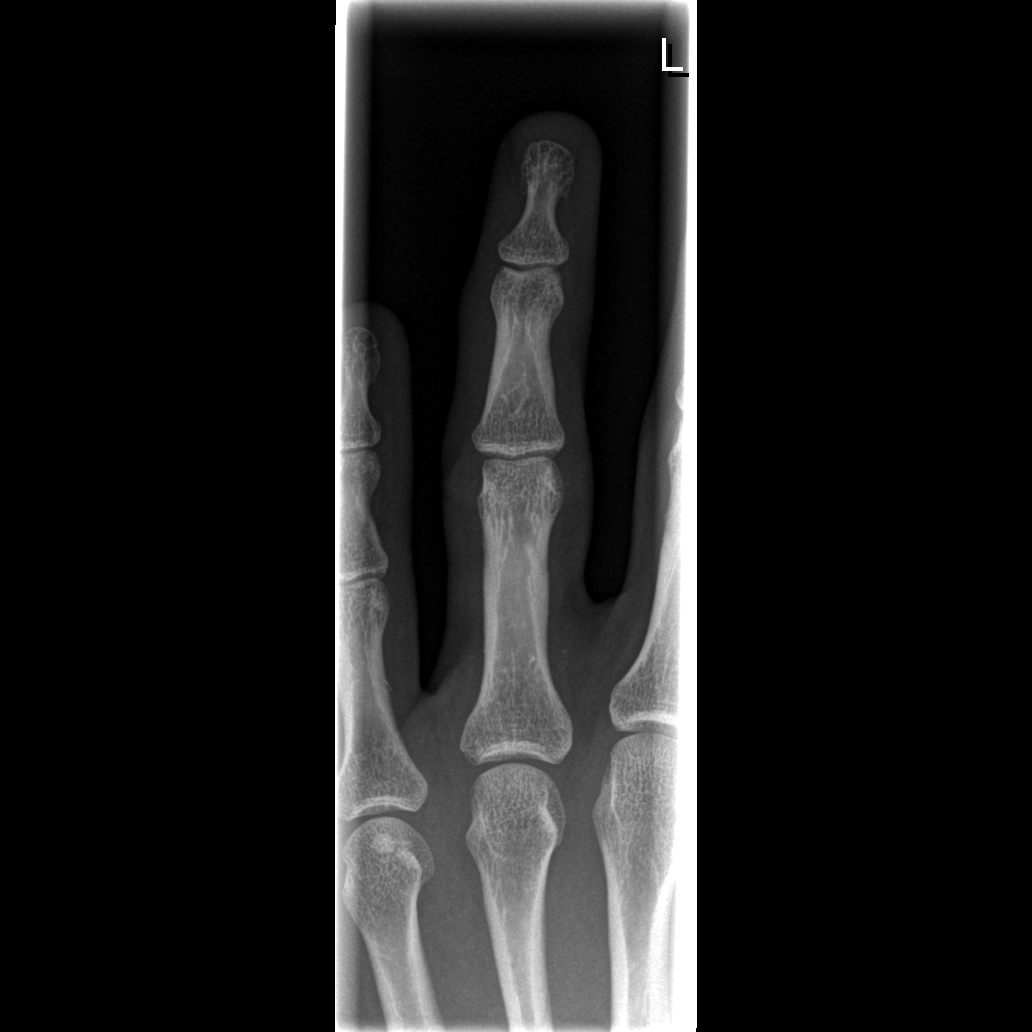

[x finger obl. left]
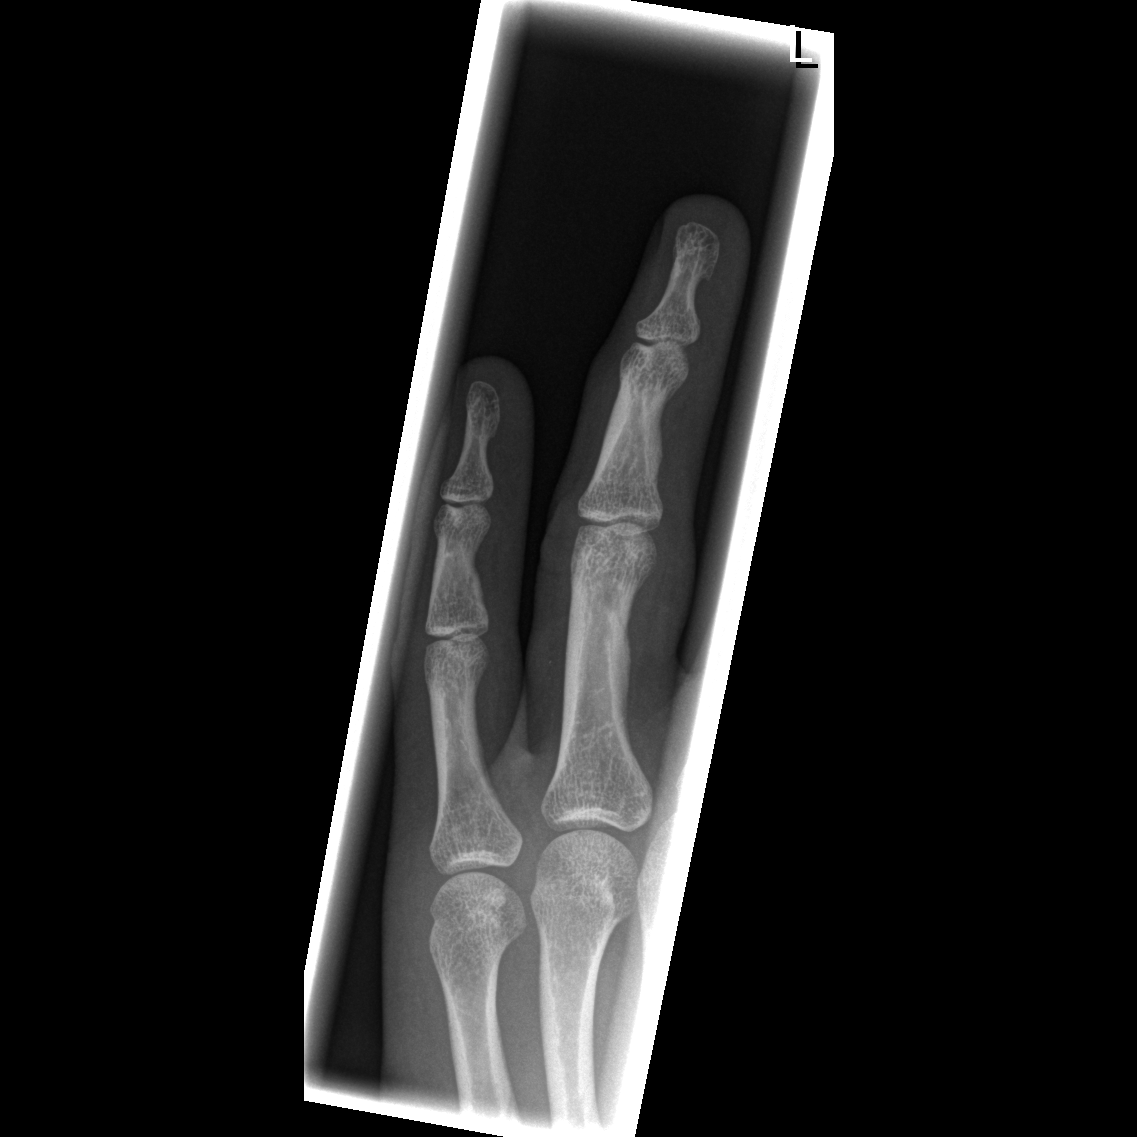

[x finger lateral left]
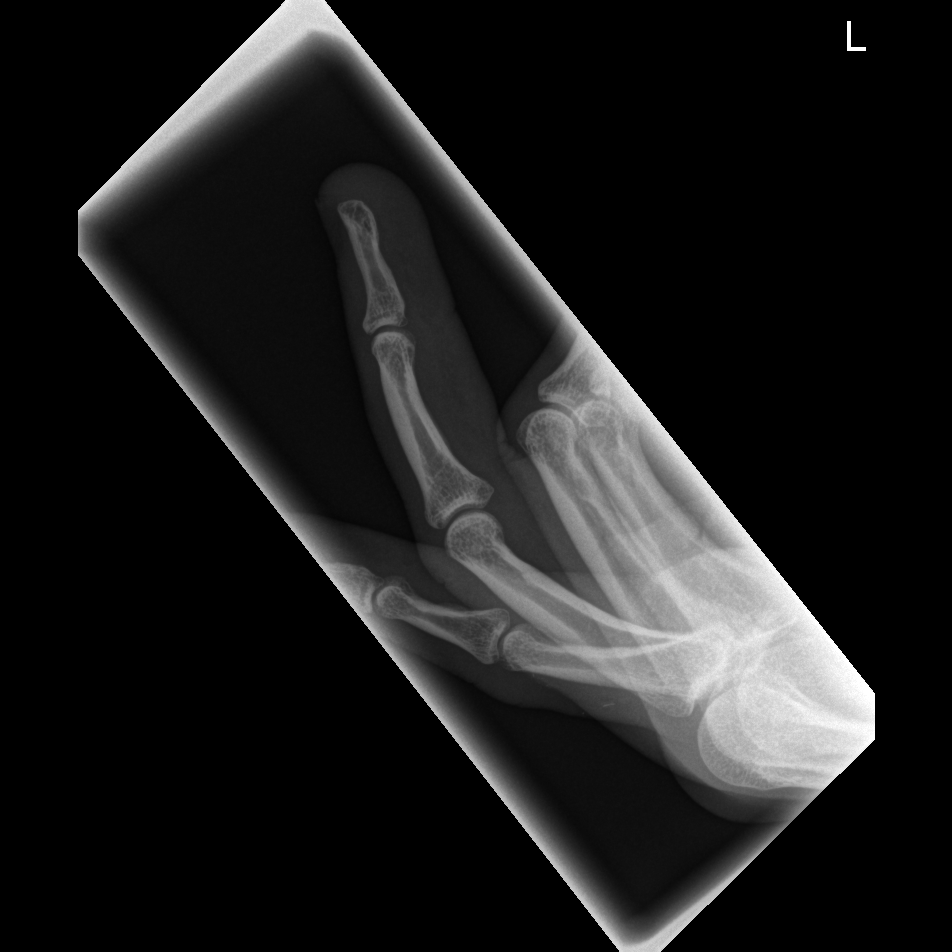

[3 of 3 positions shown; findings below may reference images not displayed]

FINDINGS: There is no evidence of fracture or dislocation. There is no
evidence of arthropathy or other focal bone abnormality. Soft
tissues are unremarkable.
IMPRESSION: Negative.

## 2019-12-29 ENCOUNTER — Encounter (HOSPITAL_BASED_OUTPATIENT_CLINIC_OR_DEPARTMENT_OTHER): Payer: Self-pay

## 2019-12-29 ENCOUNTER — Other Ambulatory Visit: Payer: Self-pay

## 2019-12-29 ENCOUNTER — Emergency Department (HOSPITAL_BASED_OUTPATIENT_CLINIC_OR_DEPARTMENT_OTHER): Payer: Self-pay

## 2019-12-29 DIAGNOSIS — S61214A Laceration without foreign body of right ring finger without damage to nail, initial encounter: Secondary | ICD-10-CM | POA: Insufficient documentation

## 2019-12-29 DIAGNOSIS — W231XXA Caught, crushed, jammed, or pinched between stationary objects, initial encounter: Secondary | ICD-10-CM | POA: Insufficient documentation

## 2019-12-29 DIAGNOSIS — Z5321 Procedure and treatment not carried out due to patient leaving prior to being seen by health care provider: Secondary | ICD-10-CM | POA: Insufficient documentation

## 2019-12-29 NOTE — ED Triage Notes (Signed)
~  12pm today ring finger got caught in [place], wedding band was shattered, lac noted, bleeding controlled

## 2019-12-30 ENCOUNTER — Emergency Department (HOSPITAL_BASED_OUTPATIENT_CLINIC_OR_DEPARTMENT_OTHER)
Admission: EM | Admit: 2019-12-30 | Discharge: 2019-12-30 | Disposition: A | Discharge status: Home / Self Care | Payer: Self-pay | Attending: Emergency Medicine | Admitting: Emergency Medicine

## 2019-12-30 ENCOUNTER — Encounter (HOSPITAL_BASED_OUTPATIENT_CLINIC_OR_DEPARTMENT_OTHER): Payer: Self-pay | Admitting: Emergency Medicine

## 2019-12-30 ENCOUNTER — Encounter (HOSPITAL_BASED_OUTPATIENT_CLINIC_OR_DEPARTMENT_OTHER): Payer: Self-pay

## 2019-12-30 ENCOUNTER — Other Ambulatory Visit: Payer: Self-pay

## 2019-12-30 DIAGNOSIS — S61215A Laceration without foreign body of left ring finger without damage to nail, initial encounter: Secondary | ICD-10-CM | POA: Insufficient documentation

## 2019-12-30 DIAGNOSIS — W231XXA Caught, crushed, jammed, or pinched between stationary objects, initial encounter: Secondary | ICD-10-CM | POA: Insufficient documentation

## 2019-12-30 DIAGNOSIS — S63655A Sprain of metacarpophalangeal joint of left ring finger, initial encounter: Secondary | ICD-10-CM

## 2019-12-30 DIAGNOSIS — J45901 Unspecified asthma with (acute) exacerbation: Secondary | ICD-10-CM | POA: Insufficient documentation

## 2019-12-30 DIAGNOSIS — F17210 Nicotine dependence, cigarettes, uncomplicated: Secondary | ICD-10-CM | POA: Insufficient documentation

## 2019-12-30 HISTORY — DX: Poisoning by unspecified narcotics, accidental (unintentional), initial encounter: T40.601A

## 2019-12-30 NOTE — ED Triage Notes (Signed)
Pt states he cut left ring finger yesterday-states he LWBS here last night-NAD-steady gait

## 2019-12-30 NOTE — ED Provider Notes (Signed)
[facility] EMERGENCY DEPARTMENT Provider Note   CSN: [id number] Arrival date & time: 12/30/19  1146     History Chief Complaint  Patient presents with  . Finger Injury    Justin Bass is a 34 y.o. male.  34 year old male with left 4th and 5th finger injuries. Patient states his ring got caught in a wrench yesterday resulting in laceration to the fingers around noon. Patient came to the ER around 10PM last night and had an xray ordered in triage however left due to an extended wait time. Last td was less than 5 years ago. Bleeding is controlled, reports pain with movement of the fingers, no other injuries or concerns. Patient is right hand dominant.         Past Medical History:  Diagnosis Date  . Allergy   . Celiac disease   . Celiac disease   . Opiate overdose [facility])     Patient Active Problem List   Diagnosis Date Noted  . Influenza-like illness 07/02/2018  . MDD (major depressive disorder), recurrent episode, moderate (HCC) 06/10/2018  . GAD (generalized anxiety disorder) 06/10/2018  . Allergy 03/12/2013  . Asthma with acute exacerbation 03/12/2013    History reviewed. No pertinent surgical history.     No family history on file.  Social History   Tobacco Use  . Smoking status: Current Some Day Smoker    Packs/day: 0.50    Types: Cigarettes  . Smokeless tobacco: Never Used  Substance Use Topics  . Alcohol use: Not Currently    Comment: occ  . Drug use: Not Currently    Types: Cocaine, IV    Home Medications Prior to Admission medications   Medication Sig Start Date End Date Taking? Authorizing Provider  albuterol (PROVENTIL HFA;VENTOLIN HFA) 108 (90 Base) MCG/ACT inhaler Inhale 2 puffs into the lungs every 4 (four) hours as needed for wheezing (cough, shortness of breath or wheezing.). 07/02/18   Everrett Coombe, DO  FLUoxetine (PROZAC) 20 MG capsule TAKE 1 CAPSULE BY MOUTH ONCE DAILY 07/22/18   Everrett Coombe, DO  hydrOXYzine (VISTARIL)  25 MG capsule Take 1 capsule (25 mg total) by mouth 3 (three) times daily as needed for anxiety. 07/03/18   Everrett Coombe, DO    Allergies    Other  Review of Systems   Review of Systems  Constitutional: Negative for fever.  Musculoskeletal: Positive for arthralgias, joint swelling and myalgias.  Skin: Positive for wound.  Allergic/Immunologic: Negative for immunocompromised state.  Neurological: Negative for weakness and numbness.  Hematological: Does not bruise/bleed easily.    Physical Exam Updated Vital Signs BP 120/72 (BP Location: Left Arm)   Pulse 73   Temp 97.9 F (36.6 C) (Oral)   Resp 16   SpO2 98%   Physical Exam Vitals and nursing note reviewed.  Constitutional:      General: He is not in acute distress.    Appearance: He is well-developed. He is not diaphoretic.  HENT:     Head: Normocephalic and atraumatic.  Cardiovascular:     Pulses: Normal pulses.  Pulmonary:     Effort: Pulmonary effort is normal.  Musculoskeletal:        General: Swelling and tenderness present. No deformity.     Comments: Approximately 1 cm laceration to dorsum of left 4th proximal phalanx, wound edges retracted, no active bleeding. Smaller wound noted to dorsum of left 5th finger as well. Brisk capillary refill to each digit. Mild swelling to 4th MCP with  minimal tenderness.   Skin:    General: Skin is warm and dry.     Findings: No erythema or rash.  Neurological:     Mental Status: He is alert and oriented to person, place, and time.     Sensory: No sensory deficit.     Motor: No weakness.  Psychiatric:        Behavior: Behavior normal.     ED Results / Procedures / Treatments   Labs (all labs ordered are listed, but only abnormal results are displayed) Labs Reviewed - No data to display  EKG None  Radiology DG Finger Ring Left  Result Date: 12/29/2019 CLINICAL DATA:  Status post trauma. EXAM: LEFT RING FINGER 2+V COMPARISON:  None. FINDINGS: There is no evidence of  fracture or dislocation. There is no evidence of arthropathy or other focal bone abnormality. Soft tissues are unremarkable. IMPRESSION: Negative. Electronically Signed   By: Aram Candela M.D.   On: 12/29/2019 22:56    Procedures Procedures (including critical care time)  Medications Ordered in ED Medications - No data to display  ED Course  I have reviewed the triage vital signs and the nursing notes.  Pertinent labs & imaging results that were available during my care of the patient were reviewed by me and considered in my medical decision making (see chart for details).  Clinical Course as of Dec 30 1211  Wed Dec 30, 2019  [id number] 34 year old with finger injury as above. XR negative for fracture. Wound is 12 + hours old, edges retracted, plan is to heal by secondary intention with monitoring for infection. Will splint for finger sprain and recommend recheck with PCP in 1 week if not improving.    [LM]    Clinical Course User Index [LM] Alden Hipp   MDM Rules/Calculators/A&P                          Final Clinical Impression(s) / ED Diagnoses Final diagnoses:  Laceration of left ring finger without foreign body without damage to nail, initial encounter  Sprain of metacarpophalangeal (MCP) joint of left ring finger, initial encounter    Rx / DC Orders ED Discharge Orders    None       Jeannie Fend, PA-C 12/30/19 1213    Alvira Monday, MD 12/31/19 1052

## 2019-12-30 NOTE — Discharge Instructions (Addendum)
Keep wound clean and dry. May apply bacitracin to wound twice daily. Splint as needed for comfort. Recheck with your doctor in 1 week as needed if not improving.

## 2020-03-17 ENCOUNTER — Other Ambulatory Visit: Payer: Self-pay

## 2020-03-17 ENCOUNTER — Emergency Department (HOSPITAL_BASED_OUTPATIENT_CLINIC_OR_DEPARTMENT_OTHER)
Admission: EM | Admit: 2020-03-17 | Discharge: 2020-03-17 | Disposition: A | Discharge status: Home / Self Care | Payer: No Typology Code available for payment source | Attending: Emergency Medicine | Admitting: Emergency Medicine

## 2020-03-17 ENCOUNTER — Emergency Department (HOSPITAL_BASED_OUTPATIENT_CLINIC_OR_DEPARTMENT_OTHER): Payer: No Typology Code available for payment source

## 2020-03-17 ENCOUNTER — Encounter (HOSPITAL_BASED_OUTPATIENT_CLINIC_OR_DEPARTMENT_OTHER): Payer: Self-pay | Admitting: *Deleted

## 2020-03-17 DIAGNOSIS — J45901 Unspecified asthma with (acute) exacerbation: Secondary | ICD-10-CM | POA: Diagnosis not present

## 2020-03-17 DIAGNOSIS — S060X0A Concussion without loss of consciousness, initial encounter: Secondary | ICD-10-CM | POA: Diagnosis not present

## 2020-03-17 DIAGNOSIS — H538 Other visual disturbances: Secondary | ICD-10-CM | POA: Insufficient documentation

## 2020-03-17 DIAGNOSIS — Y92410 Unspecified street and highway as the place of occurrence of the external cause: Secondary | ICD-10-CM | POA: Diagnosis not present

## 2020-03-17 DIAGNOSIS — S0990XA Unspecified injury of head, initial encounter: Secondary | ICD-10-CM | POA: Diagnosis present

## 2020-03-17 DIAGNOSIS — F17210 Nicotine dependence, cigarettes, uncomplicated: Secondary | ICD-10-CM | POA: Diagnosis not present

## 2020-03-17 IMAGING — CT CT HEAD W/O CM
3 series · 15 of 47 positions shown, 18 images · non-contrast
Comparison: None.

CLINICAL DATA: Head trauma, penetrating

EXAM:
CT HEAD WITHOUT CONTRAST
TECHNIQUE: Contiguous axial images were obtained from the base of the skull
through the vertex without intravenous contrast.

[Series 2: head wo · axial · 0.46mm/px · z∈[+122,+246]mm · 9 of 31 slices shown, 12 images]
[im 3/31  brain]
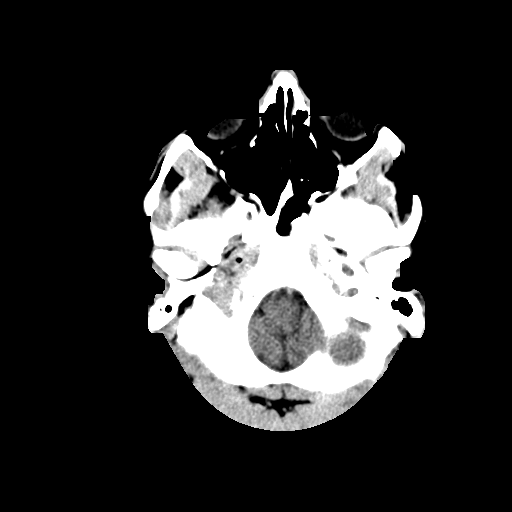
[im 3/31  bone]
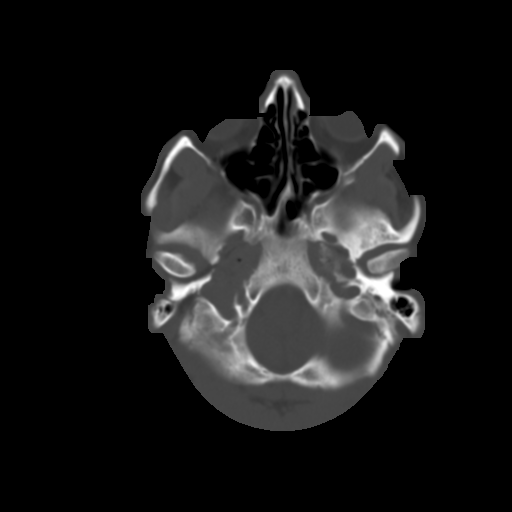
[im 6/31  brain]
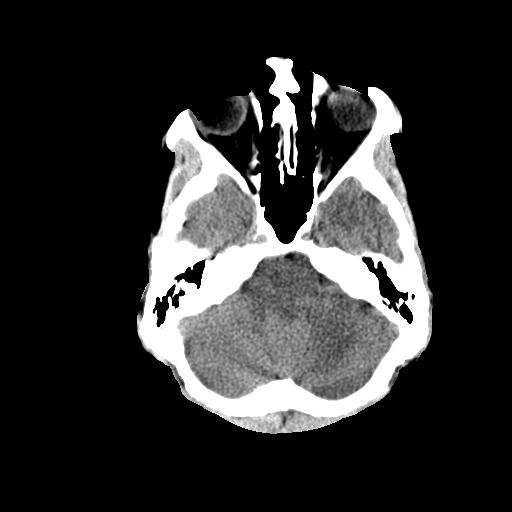
[im 9/31  brain]
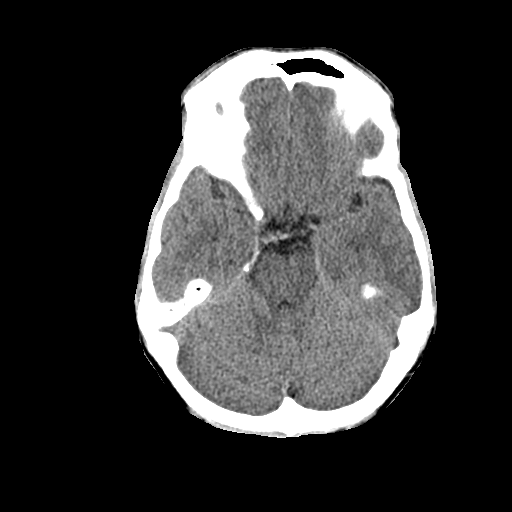
[im 12/31  brain]
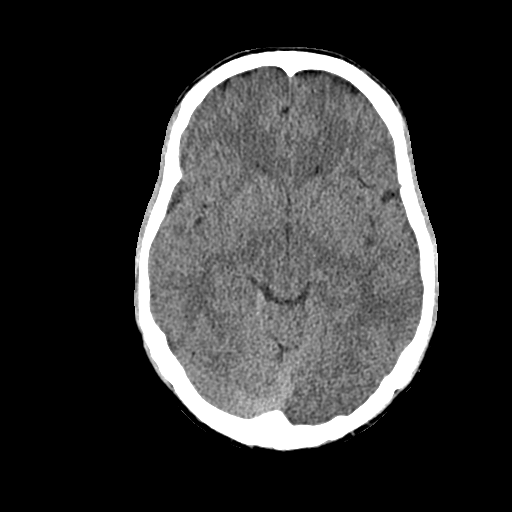
[im 16/31  brain]
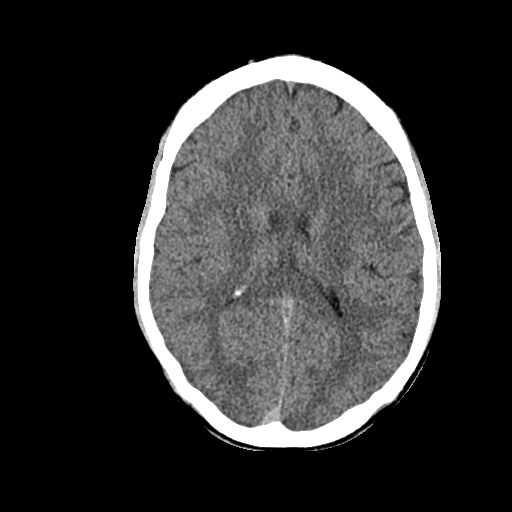
[im 16/31  bone]
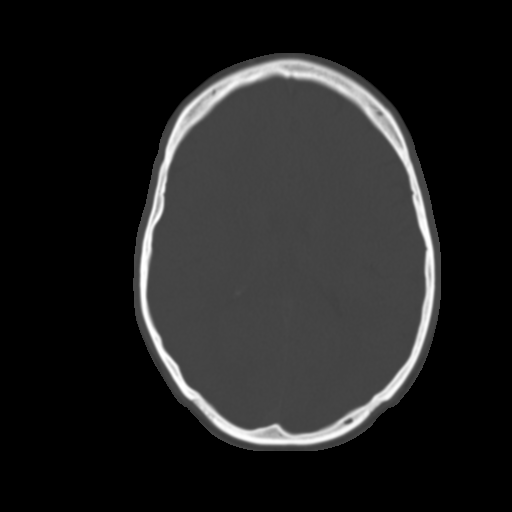
[im 19/31  brain]
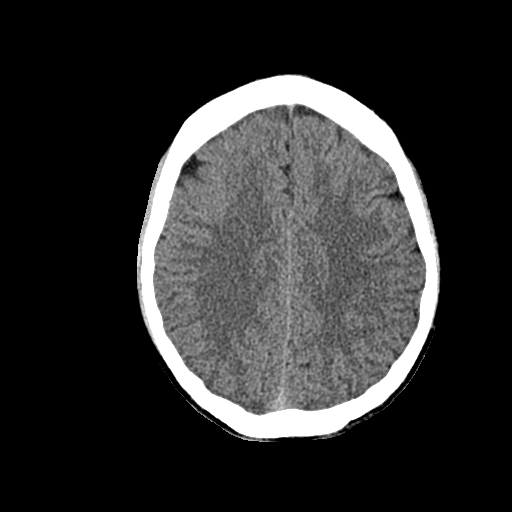
[im 22/31  brain]
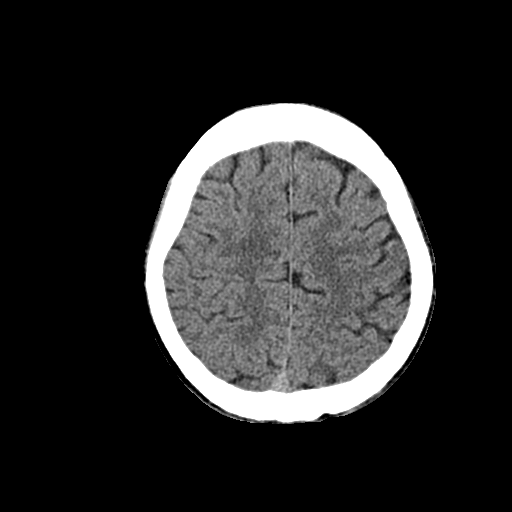
[im 25/31  brain]
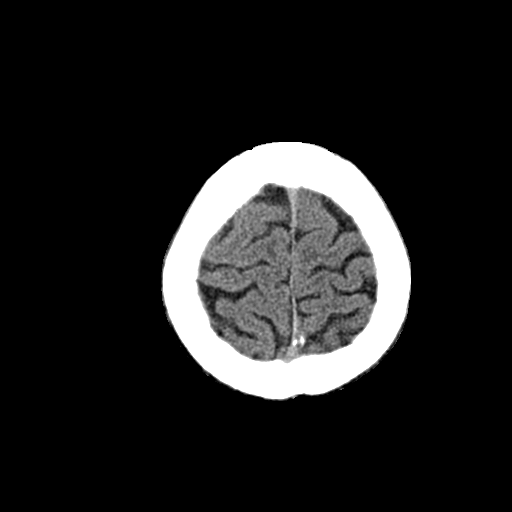
[im 28/31  brain]
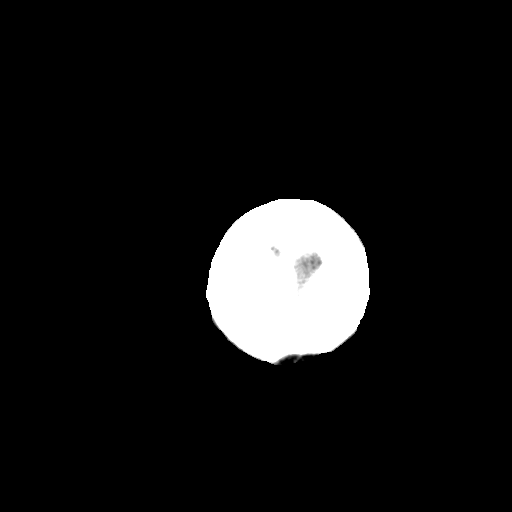
[im 28/31  bone]
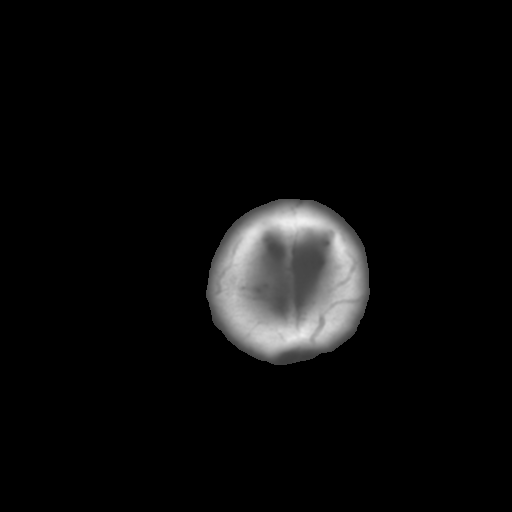

[Series 4: cor soft · coronal · 0.31mm/px · 3 of 79 slices shown]
[im 27/79  brain]
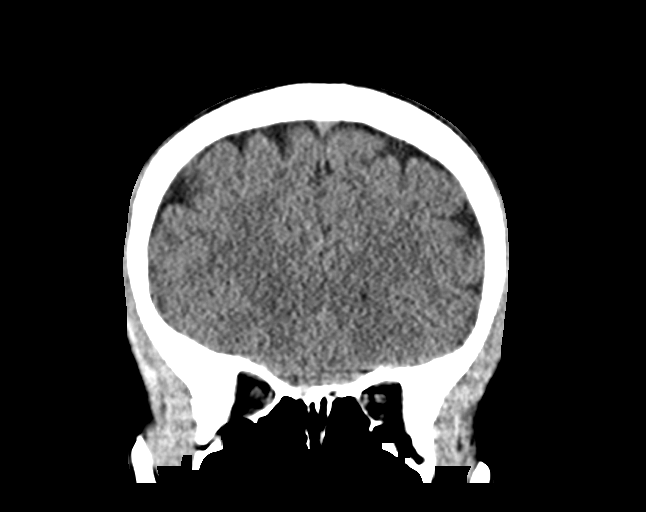
[im 35/79  brain]
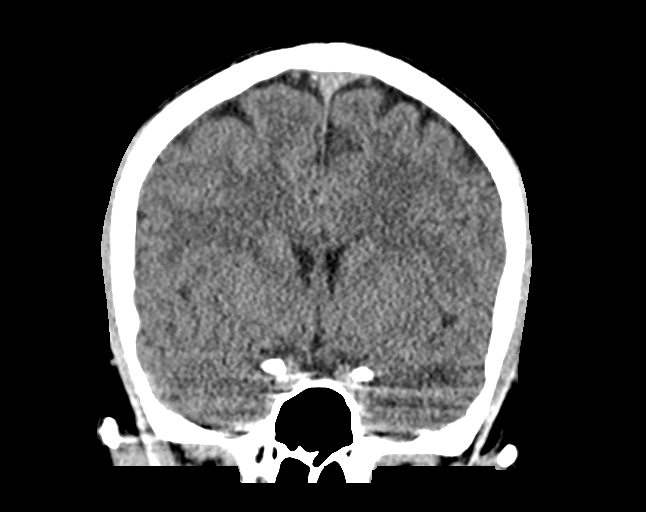
[im 44/79  brain]
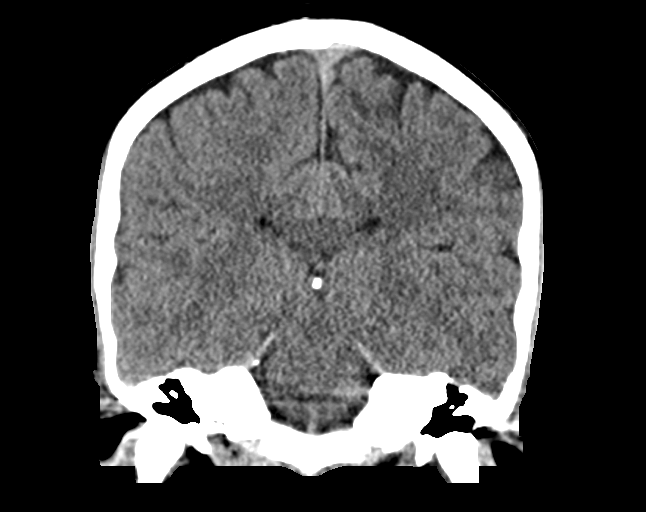

[Series 5: sag soft · sagittal · 0.29mm/px · 3 of 66 slices shown]
[im 22/66  brain]
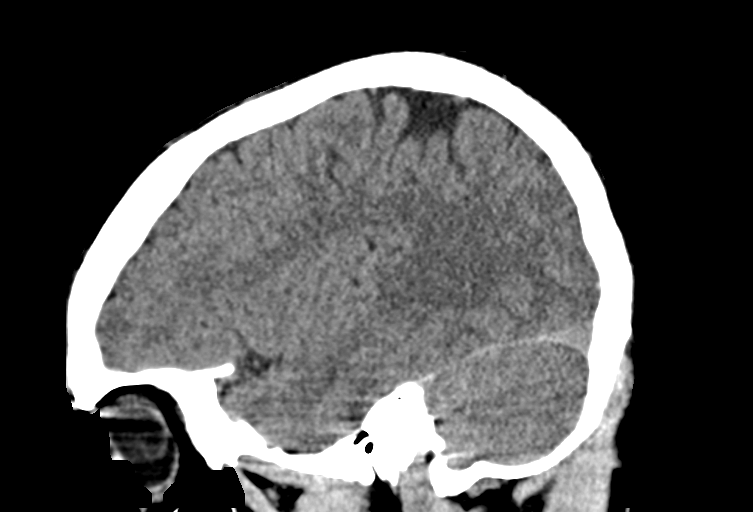
[im 33/66  brain]
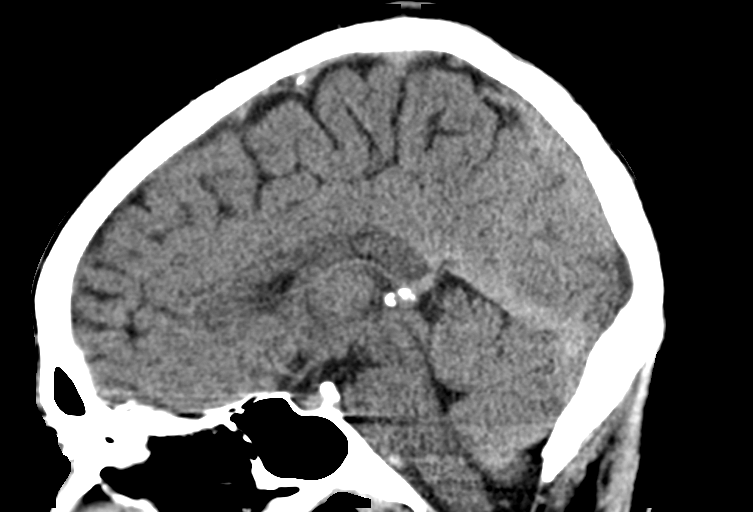
[im 44/66  brain]
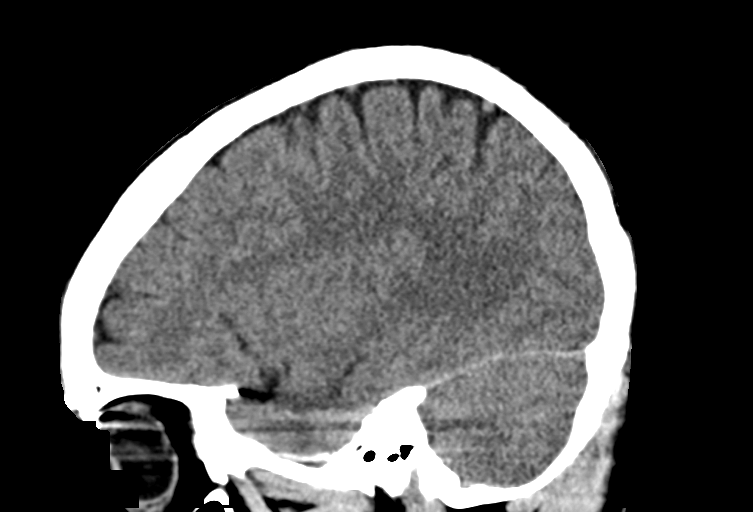

[15 of 47 positions shown; findings below may reference images not displayed]

FINDINGS: Brain: No acute infarct or intracranial hemorrhage. No mass lesion.
No midline shift, ventriculomegaly or extra-axial fluid collection.

Vascular: No hyperdense vessel or unexpected calcification.

Skull: Negative for fracture or focal lesion.

Sinuses/Orbits: Normal orbits. Clear paranasal sinuses. No mastoid
effusion.

Other: None.
IMPRESSION: No acute intracranial abnormality.

## 2020-03-17 NOTE — Discharge Instructions (Addendum)
Your work-up today was overall reassuring.  Your CAT scan did not show any evidence of intracranial injury.  Your symptoms are likely secondary to a concussion.  Please see the handout I have provided on ways to help manage this.  Please decrease screen time, and overall try to rest your brain for the next several days.  Concussions are difficult to manage, if your symptoms continue, please follow-up with the lumbar sports medicine group which helps manage concussions here in the [place] area.  Return to the ER for any new or worsening symptoms.
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
## 2020-03-18 ENCOUNTER — Telehealth: Payer: Self-pay | Admitting: *Deleted
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
## 2020-03-22 ENCOUNTER — Encounter: Payer: Self-pay | Admitting: Family Medicine

## 2020-03-22 ENCOUNTER — Other Ambulatory Visit: Payer: Self-pay

## 2020-03-22 ENCOUNTER — Ambulatory Visit (INDEPENDENT_AMBULATORY_CARE_PROVIDER_SITE_OTHER): Payer: Self-pay | Admitting: Family Medicine

## 2020-03-22 DIAGNOSIS — S060X9A Concussion with loss of consciousness of unspecified duration, initial encounter: Secondary | ICD-10-CM

## 2020-03-22 DIAGNOSIS — S060XAA Concussion with loss of consciousness status unknown, initial encounter: Secondary | ICD-10-CM | POA: Insufficient documentation
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
## 2020-03-22 NOTE — Patient Instructions (Signed)
Ok to go for short walks Choline 500mg  daily 200mg  CoQ10 2 grams of Fish oil 2000 IU of Vitamin D See me again in 7-10 days If things get worse go into ED If you change yoru mind on labs or MRI call 

## 2020-03-30 ENCOUNTER — Other Ambulatory Visit: Payer: Self-pay

## 2020-03-30 ENCOUNTER — Encounter: Payer: Self-pay | Admitting: Family Medicine

## 2020-03-30 ENCOUNTER — Ambulatory Visit (INDEPENDENT_AMBULATORY_CARE_PROVIDER_SITE_OTHER): Payer: Self-pay | Admitting: Family Medicine

## 2020-03-30 ENCOUNTER — Ambulatory Visit (INDEPENDENT_AMBULATORY_CARE_PROVIDER_SITE_OTHER): Payer: Self-pay

## 2020-03-30 VITALS — BP 120/72 | HR 77 | Ht 68.0 in | Wt 143.0 lb

## 2020-03-30 DIAGNOSIS — G8929 Other chronic pain: Secondary | ICD-10-CM

## 2020-03-30 DIAGNOSIS — M2550 Pain in unspecified joint: Secondary | ICD-10-CM

## 2020-03-30 DIAGNOSIS — R519 Headache, unspecified: Secondary | ICD-10-CM

## 2020-03-30 DIAGNOSIS — M542 Cervicalgia: Secondary | ICD-10-CM

## 2020-03-30 DIAGNOSIS — S060X9D Concussion with loss of consciousness of unspecified duration, subsequent encounter: Secondary | ICD-10-CM

## 2020-03-30 LAB — CBC WITH DIFFERENTIAL/PLATELET
Basophils Absolute: 0.1 10*3/uL (ref 0.0–0.1)
Basophils Relative: 0.9 % (ref 0.0–3.0)
Eosinophils Absolute: 0.1 10*3/uL (ref 0.0–0.7)
Eosinophils Relative: 1.4 % (ref 0.0–5.0)
HCT: 47.8 % (ref 39.0–52.0)
Hemoglobin: 15.9 g/dL (ref 13.0–17.0)
Lymphocytes Relative: 34.6 % (ref 12.0–46.0)
Lymphs Abs: 2.5 10*3/uL (ref 0.7–4.0)
MCHC: 33.3 g/dL (ref 30.0–36.0)
MCV: 88.9 fl (ref 78.0–100.0)
Monocytes Absolute: 0.5 10*3/uL (ref 0.1–1.0)
Monocytes Relative: 6.9 % (ref 3.0–12.0)
Neutro Abs: 4.1 10*3/uL (ref 1.4–7.7)
Neutrophils Relative %: 56.2 % (ref 43.0–77.0)
Platelets: 160 10*3/uL (ref 150.0–400.0)
RBC: 5.38 Mil/uL (ref 4.22–5.81)
RDW: 14.2 % (ref 11.5–15.5)
WBC: 7.3 10*3/uL (ref 4.0–10.5)

## 2020-03-30 LAB — IBC PANEL
Iron: 155 ug/dL (ref 42–165)
Saturation Ratios: 55.6 % — ABNORMAL HIGH (ref 20.0–50.0)
Transferrin: 199 mg/dL — ABNORMAL LOW (ref 212.0–360.0)

## 2020-03-30 LAB — TSH: TSH: 0.91 u[IU]/mL (ref 0.35–4.50)

## 2020-03-30 LAB — VITAMIN D 25 HYDROXY (VIT D DEFICIENCY, FRACTURES): VITD: 34.34 ng/mL (ref 30.00–100.00)

## 2020-03-30 LAB — C-REACTIVE PROTEIN: CRP: <1 mg/dL (ref 0.5–20.0)

## 2020-03-30 LAB — SEDIMENTATION RATE: Sed Rate: 5 mm/hr (ref 0–15)

## 2020-03-30 IMAGING — DX DG CERVICAL SPINE COMPLETE 4+V
5 series · 5 of 5 positions shown · non-contrast
Comparison: None.

CLINICAL DATA: Cervicalgia

EXAM:
CERVICAL SPINE - COMPLETE 4+ VIEW

[c-spine lat]
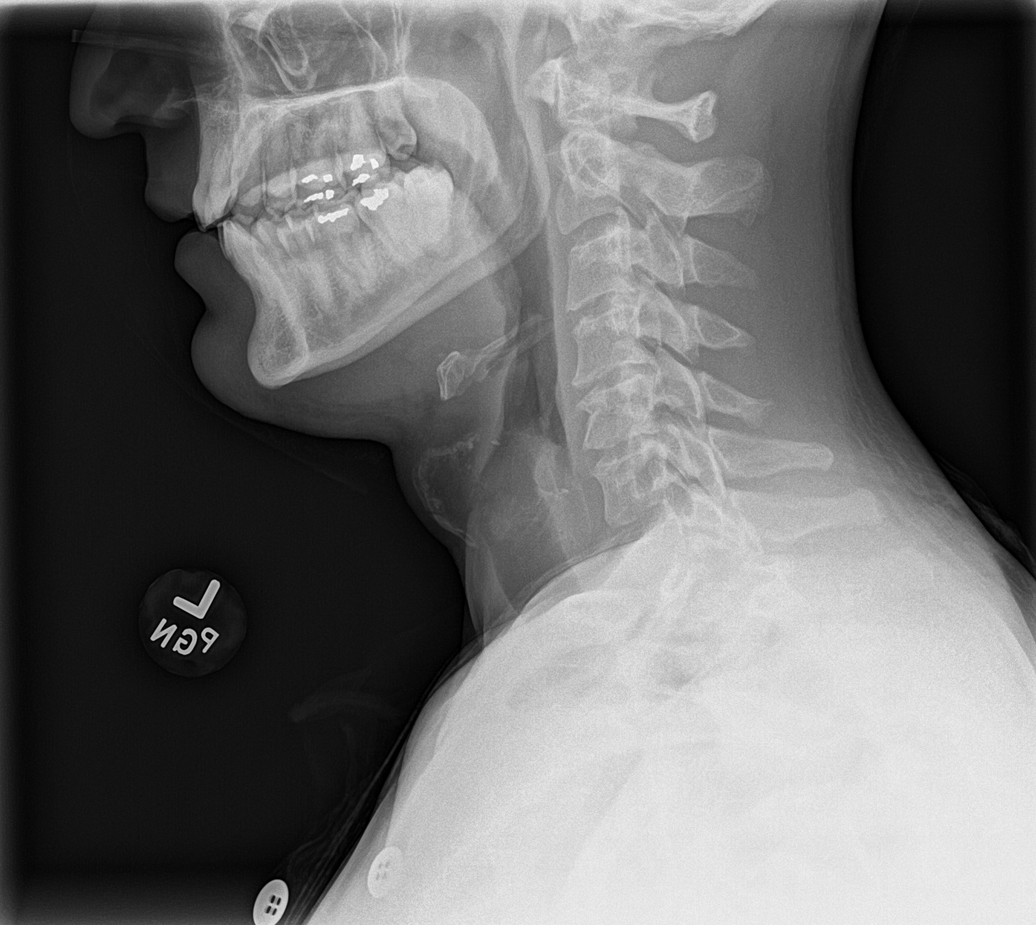

[c-spine obl (1 of 2)]
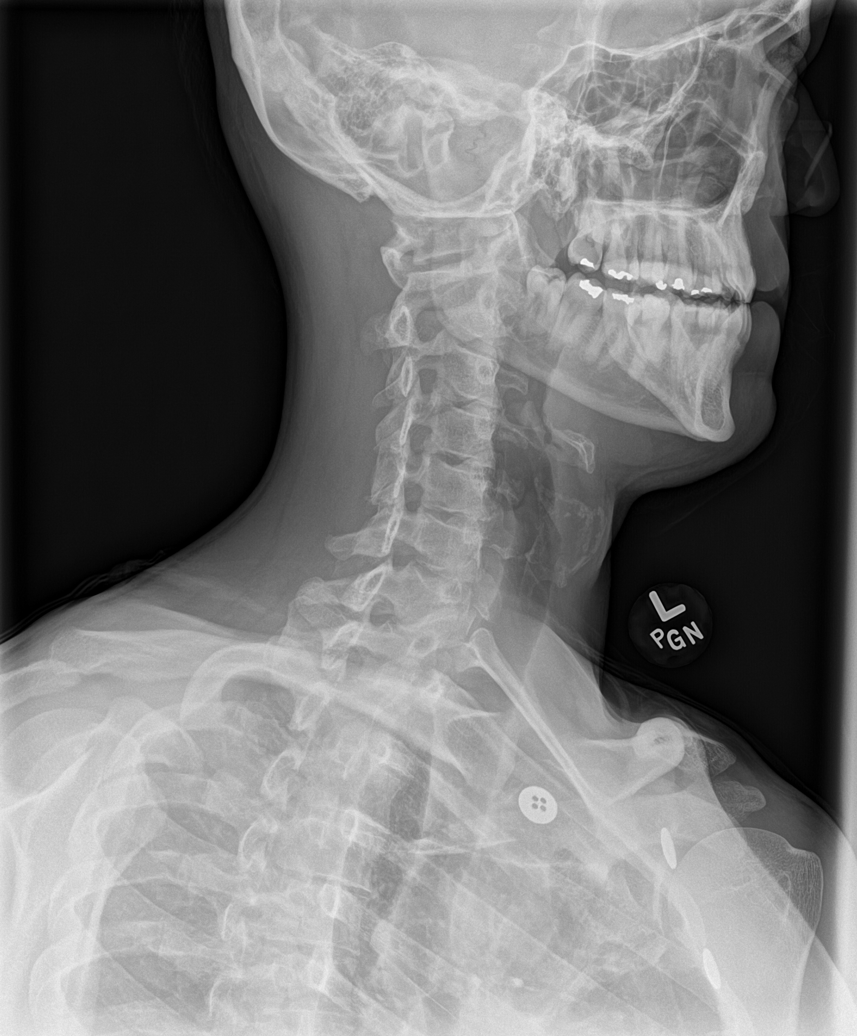

[c-spine obl (2 of 2)]
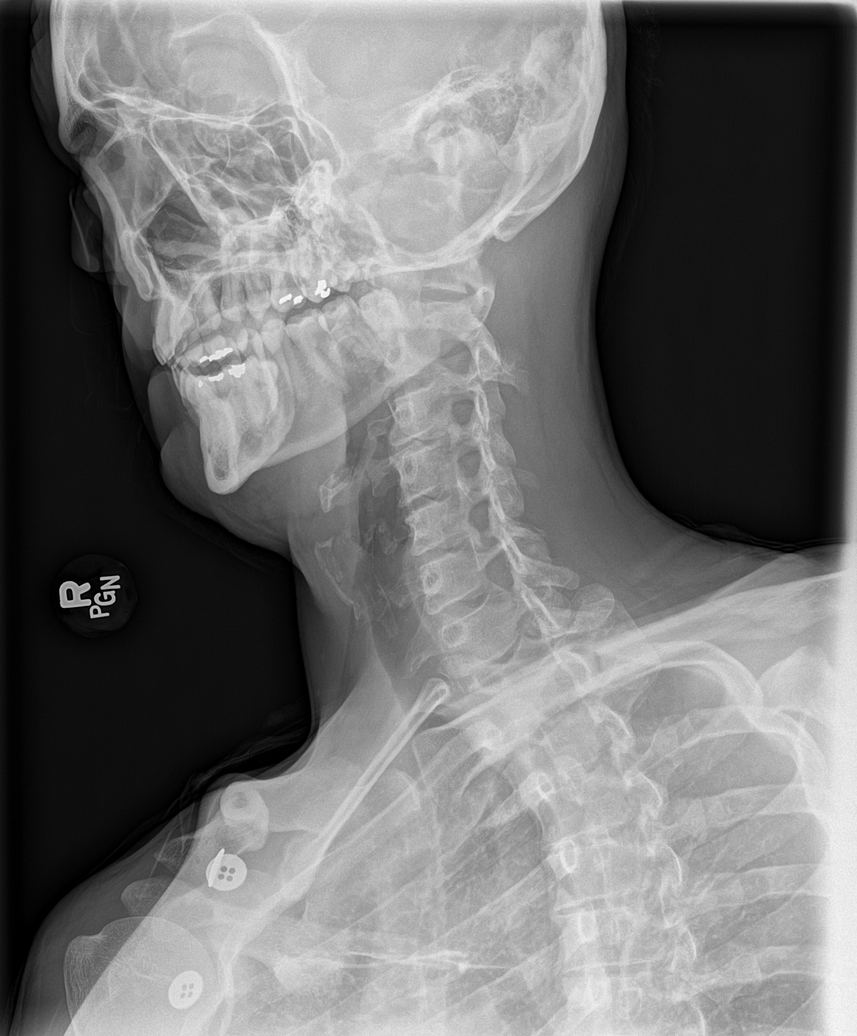

[c-spine ap]
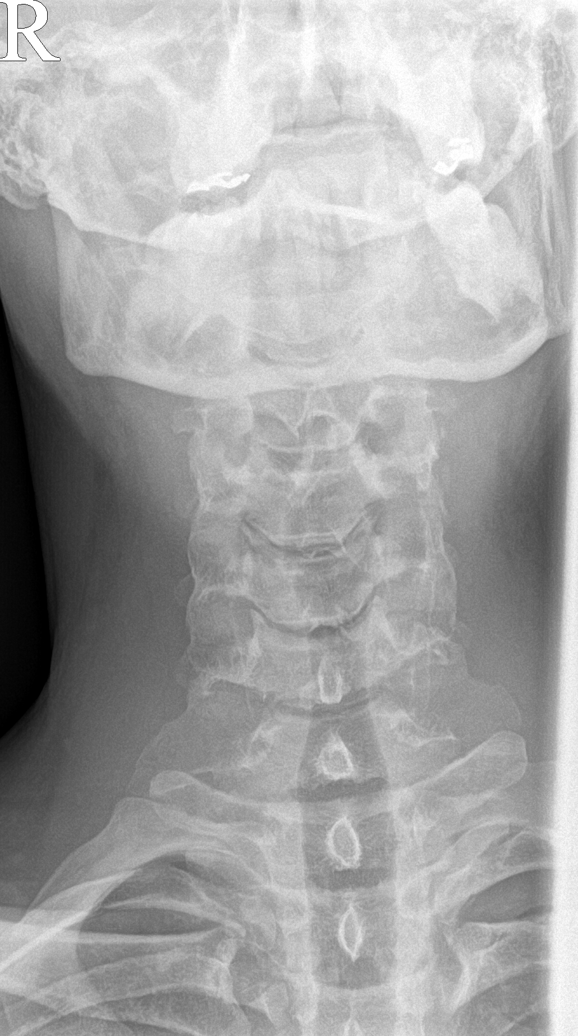

[c-spine open mouth]
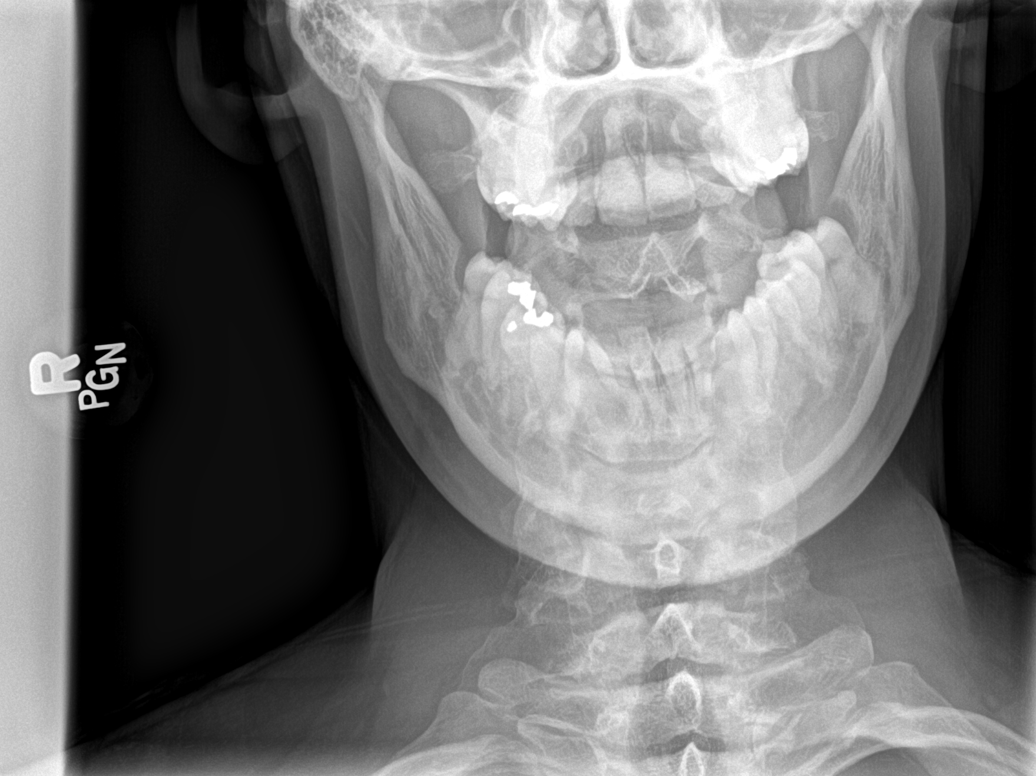

[5 of 5 positions shown; findings below may reference images not displayed]

FINDINGS: Frontal, lateral, open-mouth odontoid, and bilateral oblique views
were obtained. There is no fracture or spondylolisthesis.
Prevertebral soft tissues and predental space regions are normal.
There is moderate disc space narrowing at C4-5 and C5-6. There is
slight disc space narrowing at C3-4. Other disc spaces appear
unremarkable. There is facet hypertrophy with exit foraminal
narrowing on the right at C3-4 and C4-5 and on the left at C4-5.
Lung apices are clear.
IMPRESSION: Areas of osteoarthritic change as noted. No fracture or
spondylolisthesis.

## 2020-03-30 NOTE — Assessment & Plan Note (Addendum)
Patient symptom score today did go down from 74-57 but patient is still very concerned as well as patient's significant other.  Patient is having some mild worsening headaches.  Discussed with him that it is okay for him to try some anti-inflammatory at this juncture.  Patient CT scan previously was unremarkable.  Patient has had a history of drug use and will get a urine drug screen to make sure nothing else is contributing.  With him being significantly concerned we can get an MRI.  Patient was to be traveling for the holidays and we did recommend against traveling but they do feel that this could be more beneficial for him with his generalized anxiety disorder as well as depression.  Patient would only have an hour flight.  I discussed with them to use their best judgment but could not guarantee that that would make his headaches worse. Patient does have some mild weakness noted of the left hand more from mild swelling with no sign of infection. States it is more painful. Does not remember any injury. Only started 2 days ago. We discussed that if he was concerned with any increasing of weakness, or signs of anything such as a stroke he needs to be seek medical attention immediately. Patient understands this and feels like he is doing okay in this department. Patient affect does make it difficult to read but went over different red flags again and when to seek medical attention with him as well as with his wife at his side.

## 2020-03-30 NOTE — Patient Instructions (Addendum)
Good to see you Neck xray today 400-600 mg of ibuprofen 2 times a day If you can tolerate continue vitamins Labs today MRI brain and neck If things get worse go to ER Once you know when MRI is make appointment 1-2 days after
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
## 2020-04-07 ENCOUNTER — Other Ambulatory Visit: Payer: Self-pay | Admitting: Family Medicine

## 2020-04-07 DIAGNOSIS — Z1389 Encounter for screening for other disorder: Secondary | ICD-10-CM

## 2020-04-18 ENCOUNTER — Ambulatory Visit (INDEPENDENT_AMBULATORY_CARE_PROVIDER_SITE_OTHER): Payer: Self-pay

## 2020-04-18 ENCOUNTER — Other Ambulatory Visit: Payer: Self-pay

## 2020-04-18 DIAGNOSIS — M5030 Other cervical disc degeneration, unspecified cervical region: Secondary | ICD-10-CM

## 2020-04-18 DIAGNOSIS — M542 Cervicalgia: Secondary | ICD-10-CM

## 2020-04-18 DIAGNOSIS — R519 Headache, unspecified: Secondary | ICD-10-CM

## 2020-04-18 DIAGNOSIS — G8929 Other chronic pain: Secondary | ICD-10-CM

## 2020-04-18 DIAGNOSIS — Z1389 Encounter for screening for other disorder: Secondary | ICD-10-CM

## 2020-04-18 IMAGING — MR MR HEAD WO/W CM
12 series · 48 of 48 positions shown · IV contrast (GADAVIST)
Comparison: [DATE].

CLINICAL DATA: Neck pain, acute, no red flags neck pain; Headache,
chronic, no new features Head pain

EXAM:
MRI HEAD WITHOUT AND WITH CONTRAST
MRI CERVICAL SPINE WITHOUT CONTRAST
TECHNIQUE: Multiplanar, multiecho pulse sequences of the brain and surrounding
structures were obtained without and with intravenous contrast.
Multiplanar, multiecho pulse sequences of the cervical spine, to
include the craniocervical junction and cervicothoracic junction,
were obtained without intravenous contrast.
CONTRAST:  7.5mL GADAVIST GADOBUTROL 1 MMOL/ML IV SOLN

[Series 3: DWI · axial · 3.0mm · 1.20mm/px · z∈[-44,+118]mm · 8 of 110 slices shown (1 of 4)]
[im 1/110]
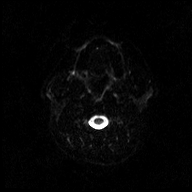
[im 16/110]
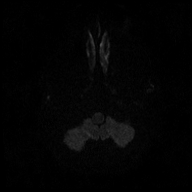
[im 32/110]
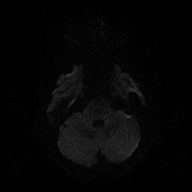
[im 47/110]
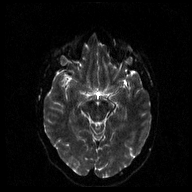
[im 63/110]
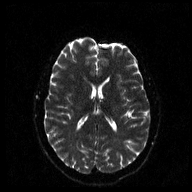
[im 78/110]
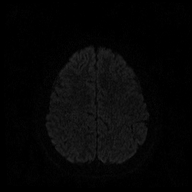
[im 94/110]
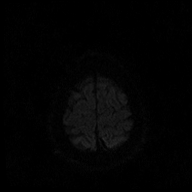
[im 110/110]
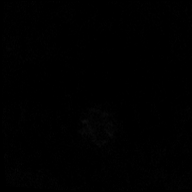

[Series 4: DWI · axial · 3.0mm · 1.20mm/px · z∈[-44,+118]mm · 3 of 55 slices shown (2 of 4)]
[im 1/55]
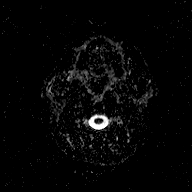
[im 28/55]
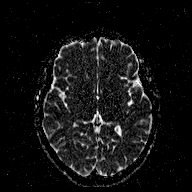
[im 55/55]
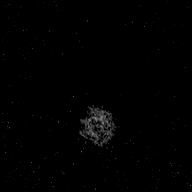

[Series 5: DWI · coronal · 3.0mm · 1.15mm/px · 6 of 93 slices shown (3 of 4)]
[im 1/93]
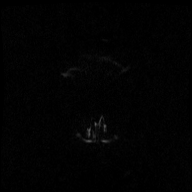
[im 19/93]
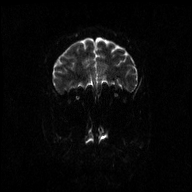
[im 37/93]
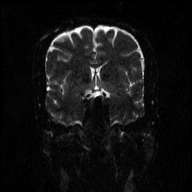
[im 56/93]
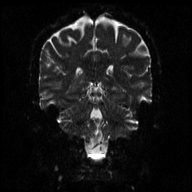
[im 74/93]
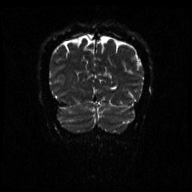
[im 93/93]
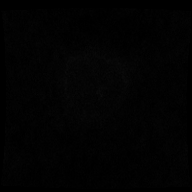

[Series 6: DWI · coronal · 3.0mm · 1.15mm/px · 3 of 47 slices shown (4 of 4)]
[im 1/47]
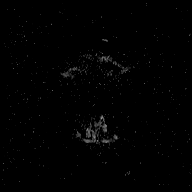
[im 24/47]
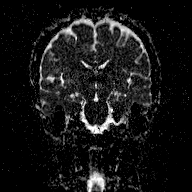
[im 47/47]
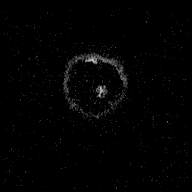

[Series 7: T1 · sagittal · 5.0mm · 0.45mm/px · 1 of 23 slices shown (1 of 2)]
[im 1/23]
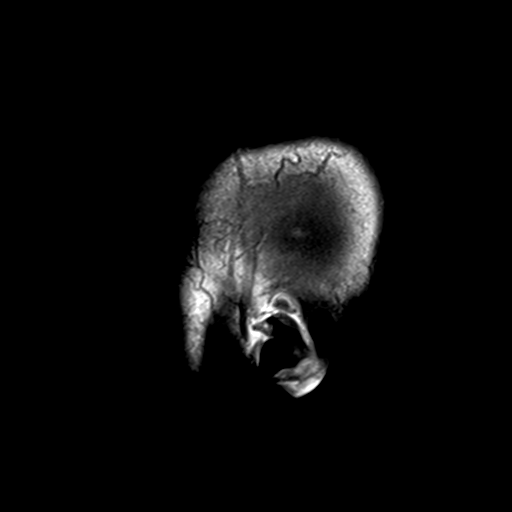

[Series 8: T2 · axial · 5.0mm · 0.72mm/px · 1 of 23 slices shown (1 of 2)]
[im 1/23]
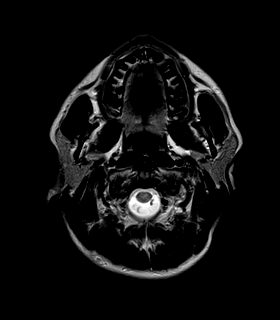

[Series 9: FLAIR · axial · 3.0mm · 0.45mm/px · z∈[-43,+119]mm · 3 of 55 slices shown]
[im 1/55]
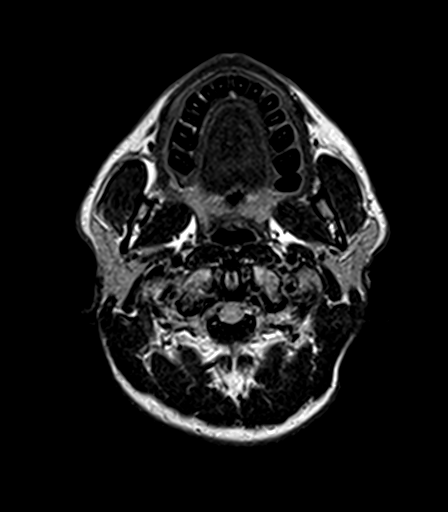
[im 28/55]
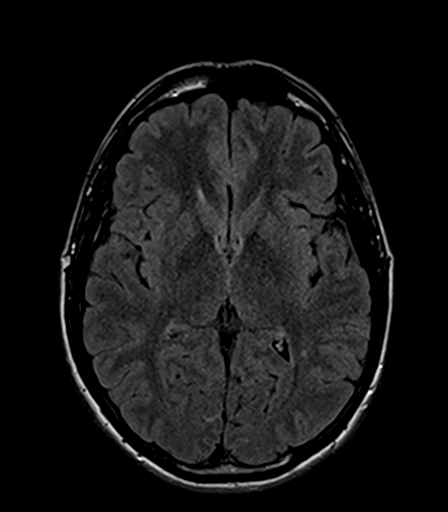
[im 55/55]
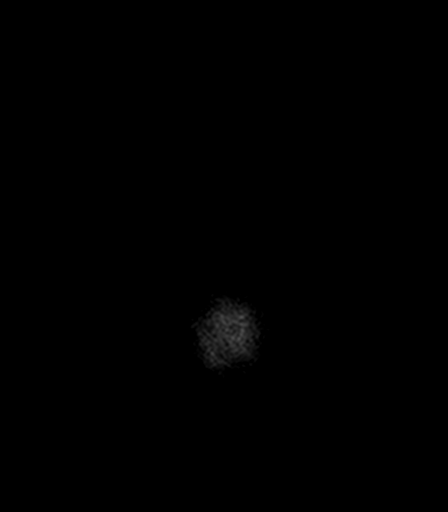

[Series 10: T2 · axial · 5.0mm · 0.72mm/px · 1 of 23 slices shown (2 of 2)]
[im 1/23]
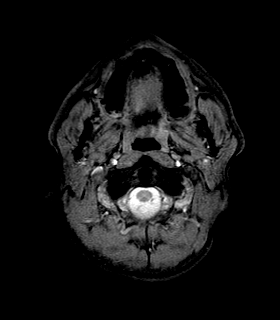

[Series 11: T1 · axial · 1.0mm · 1.00mm/px · z∈[-42,+117]mm · 9 of 160 slices shown (2 of 2)]
[im 1/160]
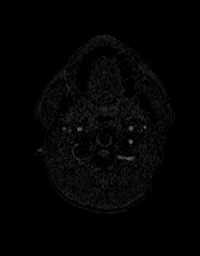
[im 20/160]
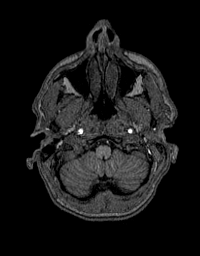
[im 40/160]
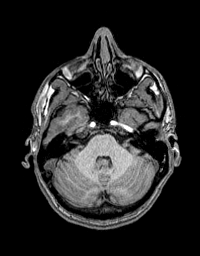
[im 60/160]
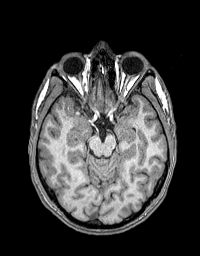
[im 80/160]
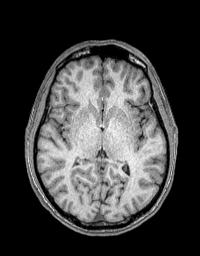
[im 100/160]
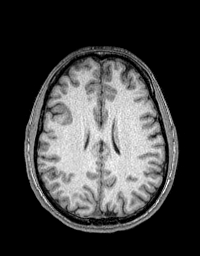
[im 120/160]
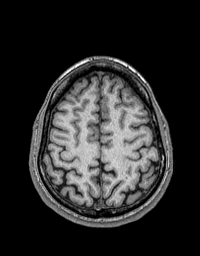
[im 140/160]
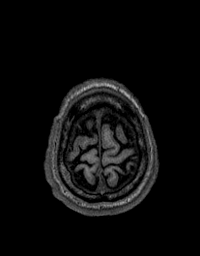
[im 160/160]
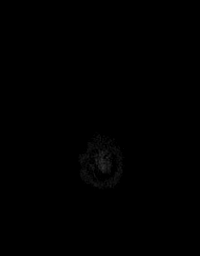

[Series 12: T2 post-contrast · coronal · 5.0mm · 0.43mm/px · 2 of 31 slices shown]
[im 1/31]
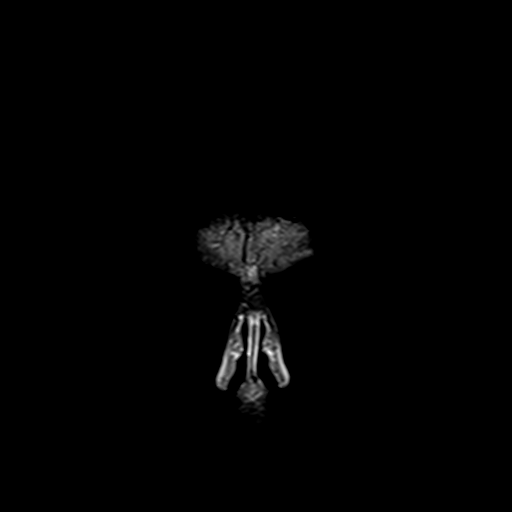
[im 31/31]
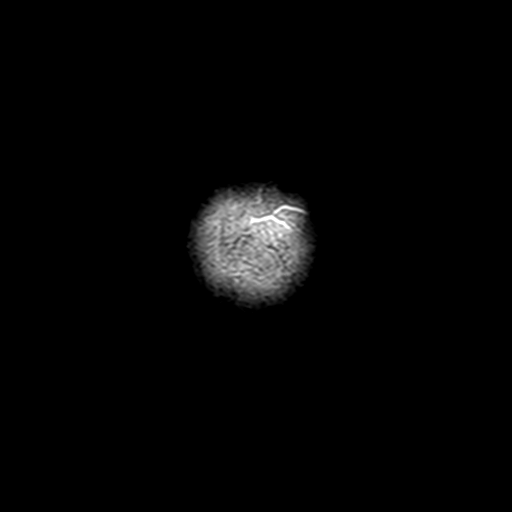

[Series 13: T1 post-contrast · axial · 1.0mm · 1.00mm/px · z∈[-42,+116]mm · 9 of 159 slices shown (1 of 2)]
[im 1/159]
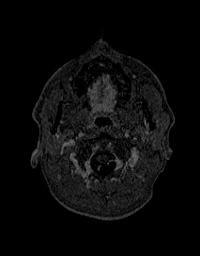
[im 20/159]
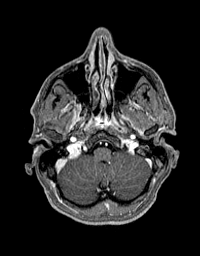
[im 40/159]
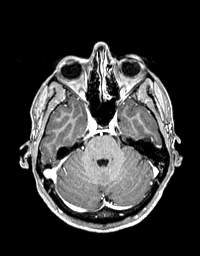
[im 60/159]
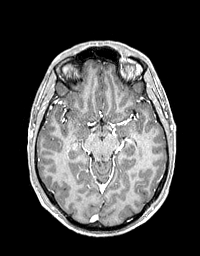
[im 80/159]
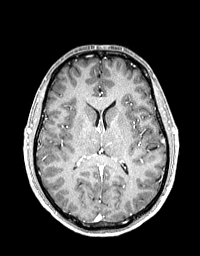
[im 99/159]
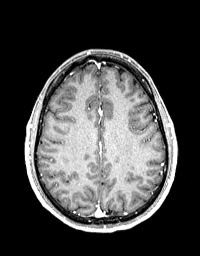
[im 119/159]
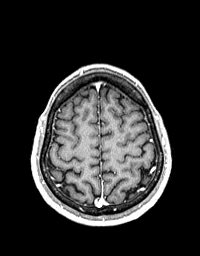
[im 139/159]
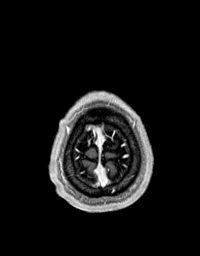
[im 159/159]
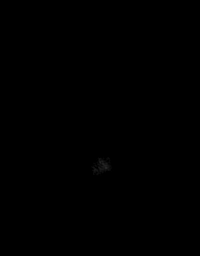

[Series 14: T1 post-contrast · coronal · 5.0mm · 0.43mm/px · 2 of 31 slices shown (2 of 2)]
[im 1/31]
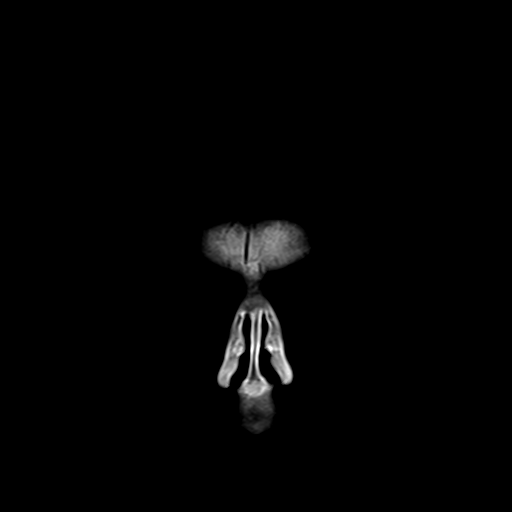
[im 31/31]
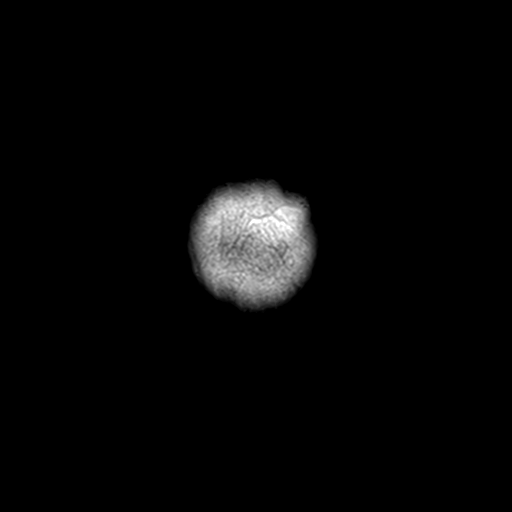

[48 of 48 positions shown; findings below may reference images not displayed]

FINDINGS: MRI HEAD FINDINGS

Brain: No diffusion-weighted signal abnormality. No intracranial
hemorrhage. No midline shift, ventriculomegaly or extra-axial fluid
collection. No mass lesion.

Vascular: Normal flow voids.

Skull and upper cervical spine: Normal marrow signal.

Sinuses/Orbits: Normal orbits. Minimal ethmoid and maxillary sinus
mucosal thickening. No mastoid effusion.

Other: None.

MRI CERVICAL SPINE FINDINGS

Alignment: Physiologic.

Vertebrae: Normal bone marrow signal intensity. No focal osseous
lesion.

Cord: Normal signal and morphology.

Posterior Fossa, vertebral arteries: Negative.

Disc levels: Negative.

C2-3: No significant disc bulge. Patent spinal canal and neural
foramen.

C3-4: Small disc osteophyte complex with uncovertebral and facet
degenerative spurring. Patent spinal canal and neural foramen.

C4-5: Small disc osteophyte complex with uncovertebral and facet
degenerative spurring. Patent spinal canal and right neural foramen.
Mild left neural foraminal narrowing.

C5-6: Shallow right paracentral protrusion with uncovertebral
degenerative spurring. Patent spinal canal and left neural foramen.
Mild right neural foraminal narrowing.

C6-7: No significant disc bulge. Patent spinal canal and neural
foramen.

C7-T1: No significant disc bulge. Patent spinal canal and neural
foramen.

Paraspinal tissues: Negative.
IMPRESSION: MRI head:

No evidence of acute or remote insult.  No abnormal enhancement.

MRI cervical spine:

No acute finding.

Mild multilevel degenerative changes. No significant spinal canal
narrowing or cord impingement.

Mild left C4-5 and right C5-6 neural foraminal narrowing.

## 2020-04-18 IMAGING — DX DG ORBITS FOR FOREIGN BODY
2 series · 2 of 2 positions shown · non-contrast
Comparison: None.

CLINICAL DATA: Metal working/exposure; clearance prior to MRI

EXAM:
ORBITS FOR FOREIGN BODY - 2 VIEW

[orbits waters (1 of 2)]
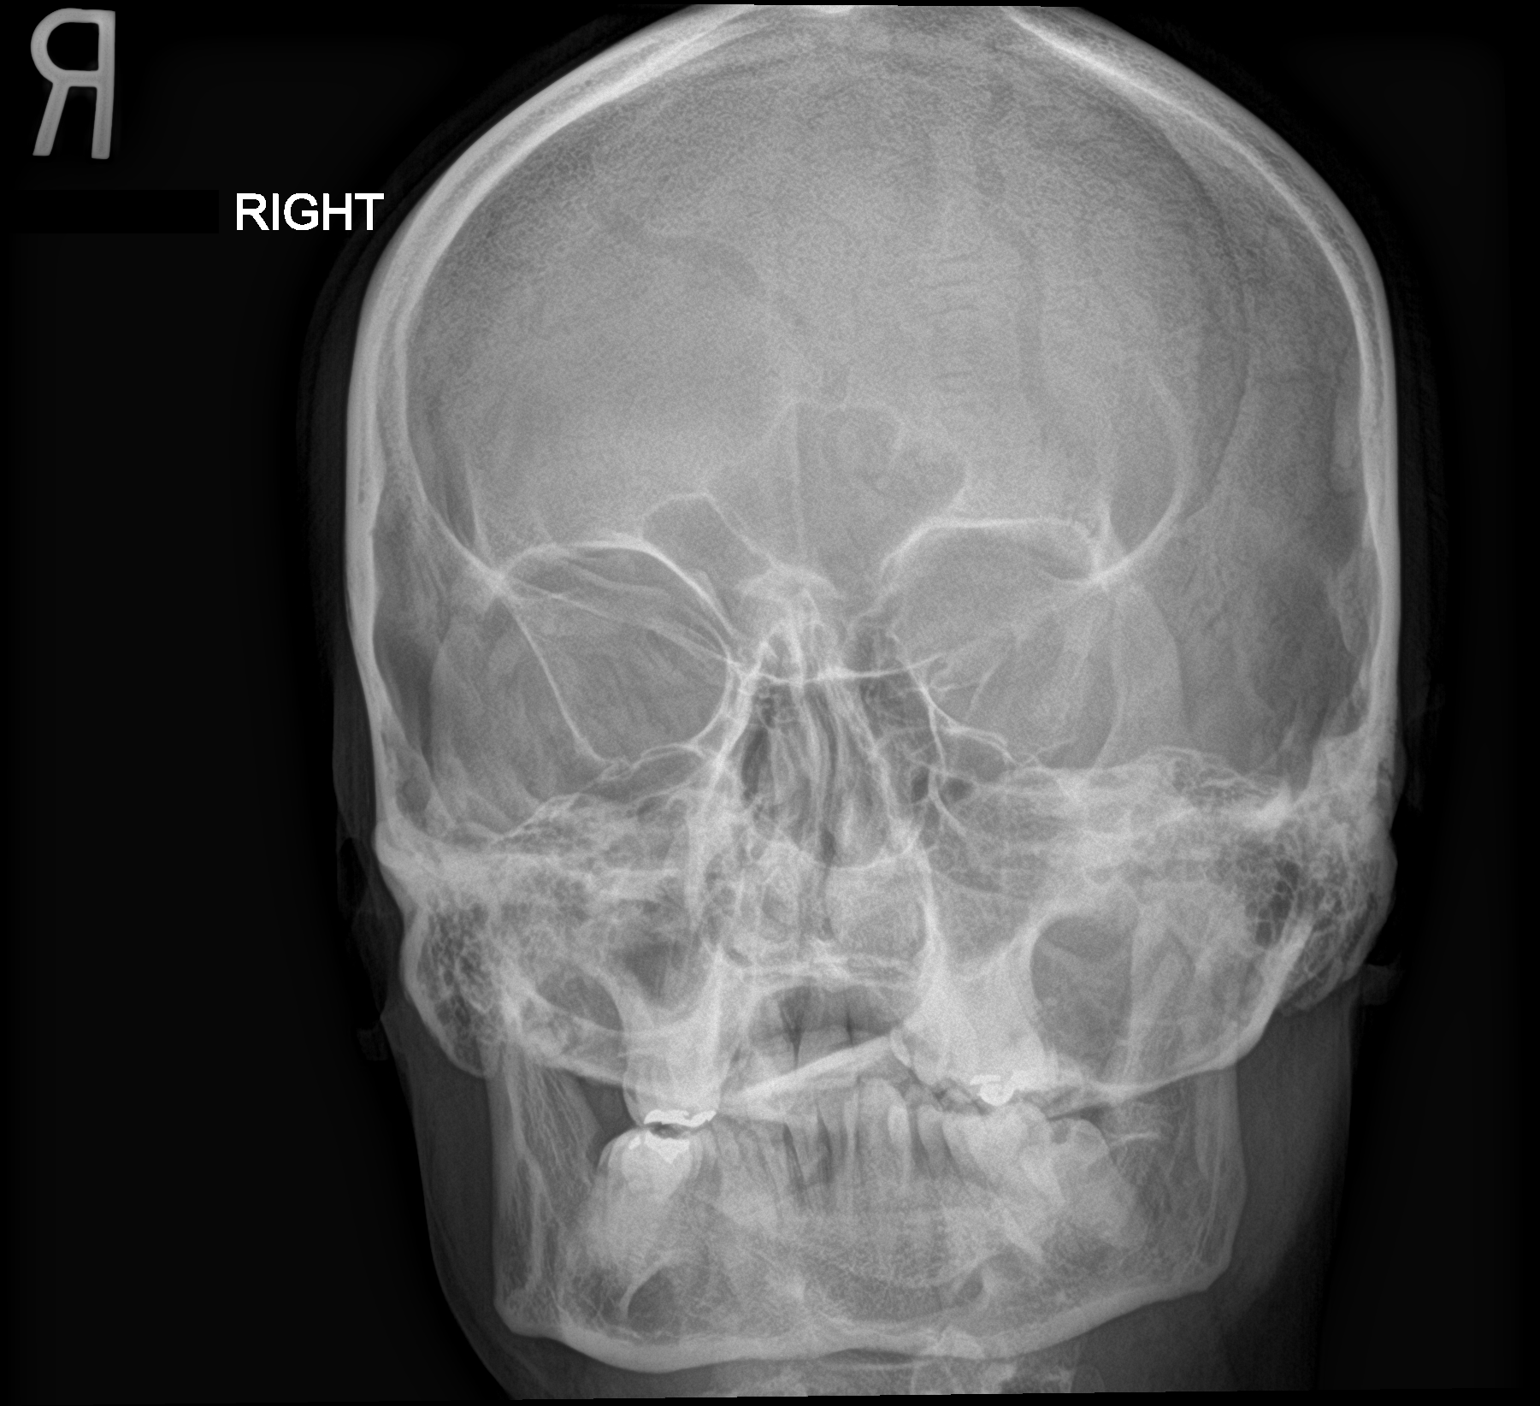

[orbits waters (2 of 2)]
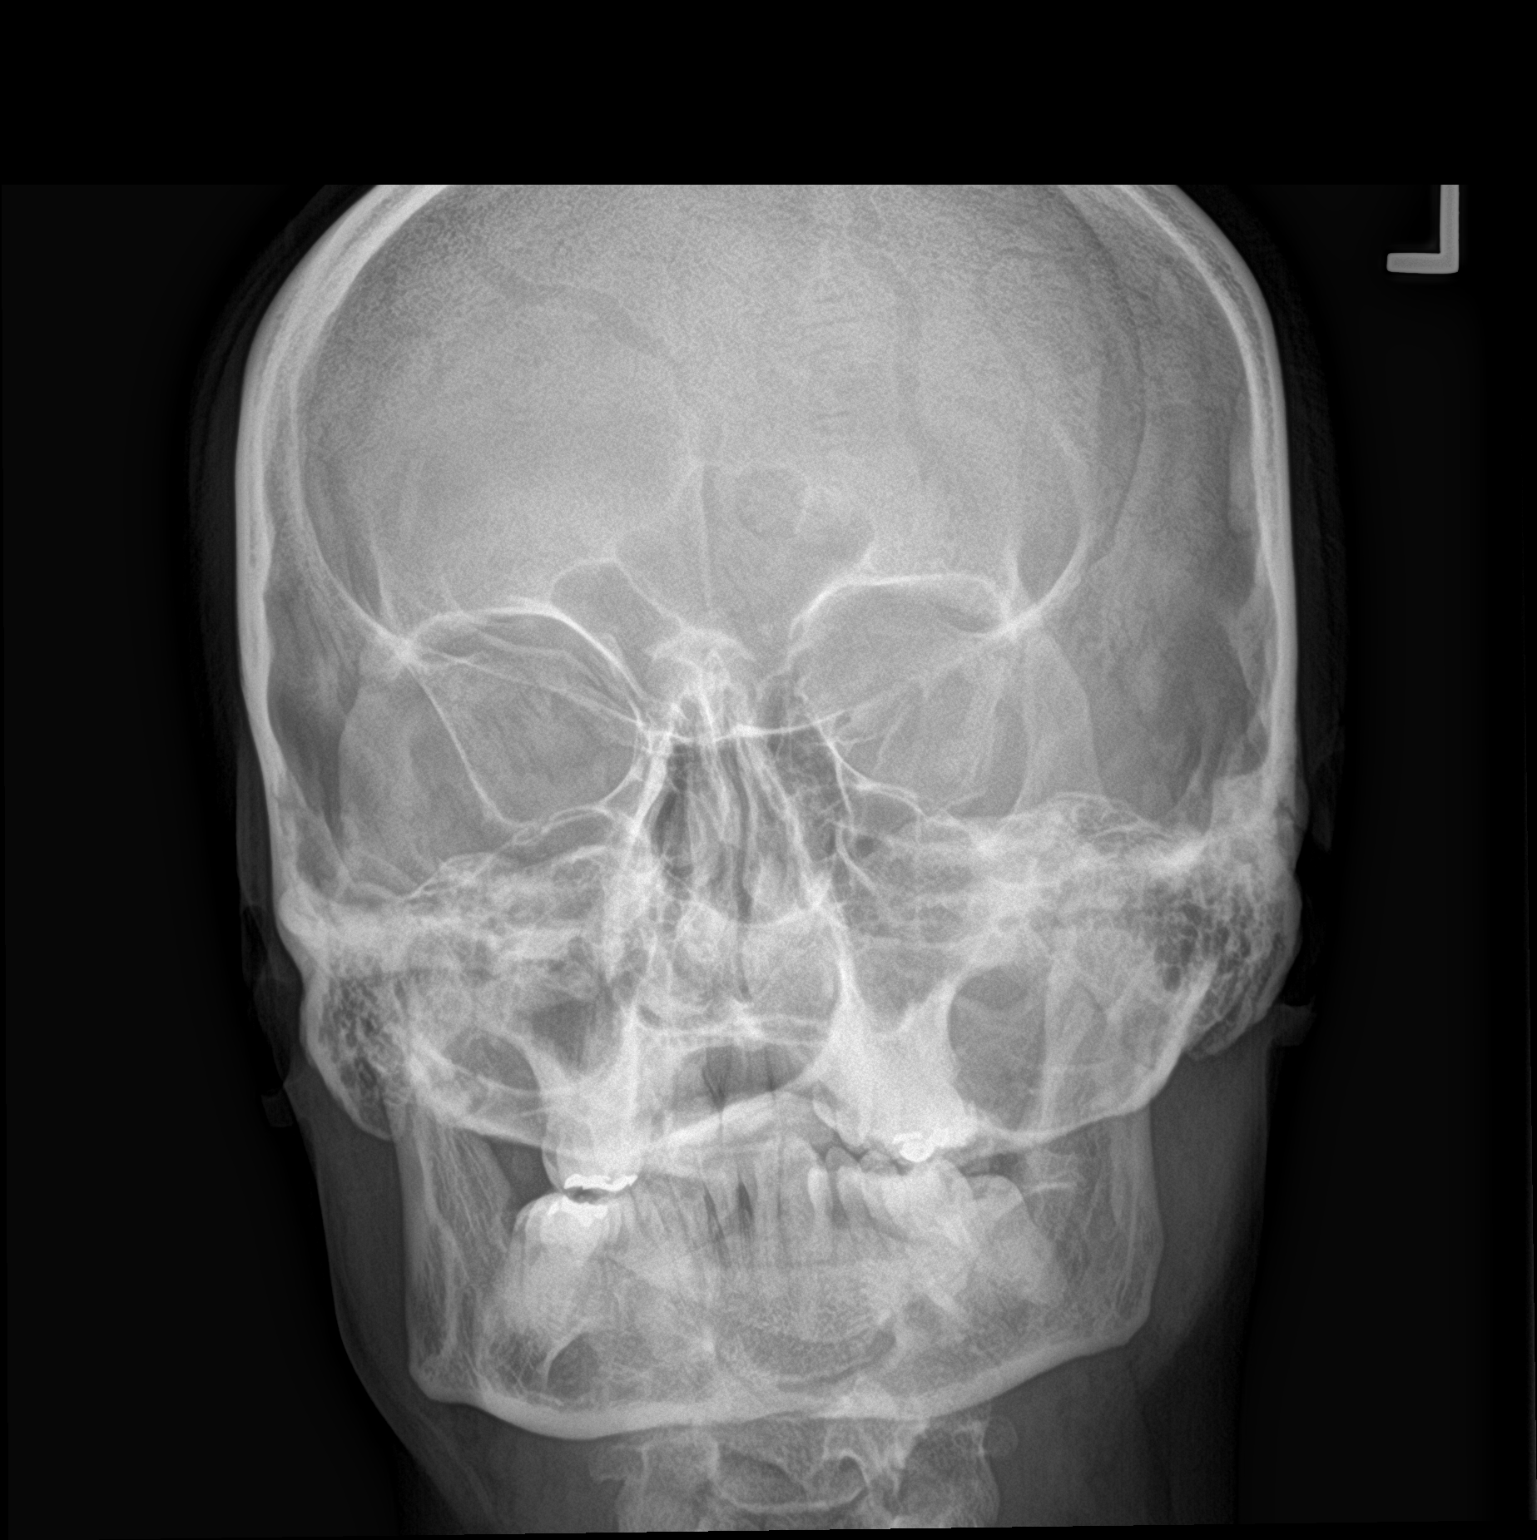

[2 of 2 positions shown; findings below may reference images not displayed]

FINDINGS: There is no evidence of metallic foreign body within the orbits. No
significant bone abnormality identified.
IMPRESSION: No evidence of metallic foreign body within the orbits.

## 2020-04-18 IMAGING — MR MR CERVICAL SPINE W/O CM
5 series · 39 of 48 positions shown · IV contrast (gadavist)
Comparison: [DATE].

CLINICAL DATA: Neck pain, acute, no red flags neck pain; Headache,
chronic, no new features Head pain

EXAM:
MRI HEAD WITHOUT AND WITH CONTRAST
MRI CERVICAL SPINE WITHOUT CONTRAST
TECHNIQUE: Multiplanar, multiecho pulse sequences of the brain and surrounding
structures were obtained without and with intravenous contrast.
Multiplanar, multiecho pulse sequences of the cervical spine, to
include the craniocervical junction and cervicothoracic junction,
were obtained without intravenous contrast.
CONTRAST:  7.5mL GADAVIST GADOBUTROL 1 MMOL/ML IV SOLN

[Series 6: T2 · sagittal · 3.0mm · 0.69mm/px · 7 of 13 slices shown (1 of 2)]
[im 1/13]
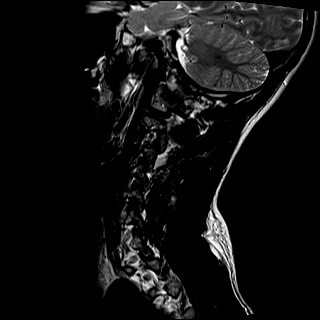
[im 3/13]
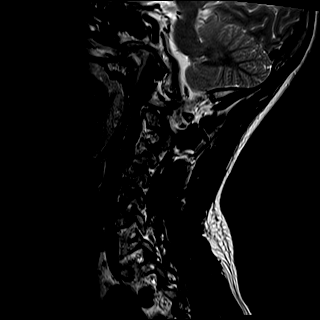
[im 5/13]
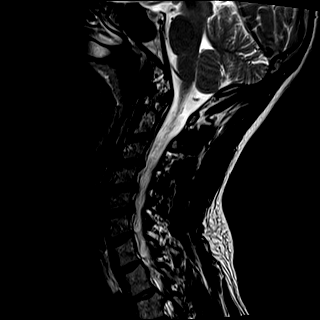
[im 7/13]
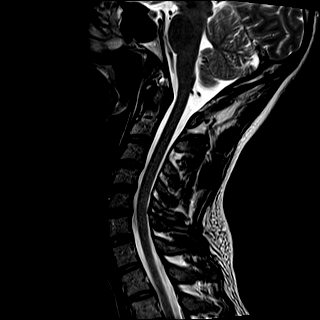
[im 9/13]
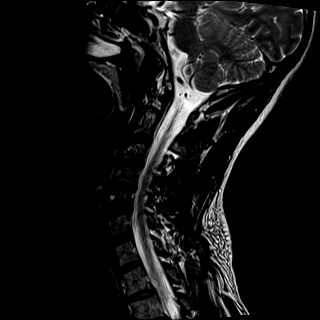
[im 11/13]
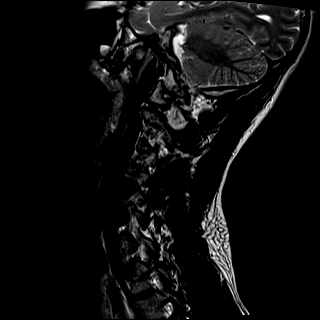
[im 13/13]
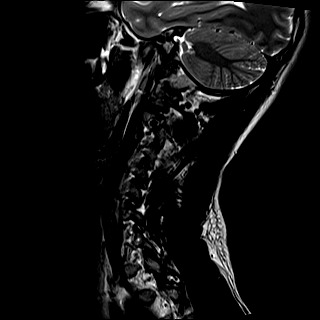

[Series 7: T1 · sagittal · 3.0mm · 0.86mm/px · 7 of 13 slices shown]
[im 1/13]
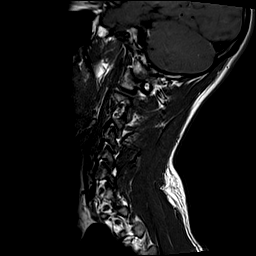
[im 3/13]
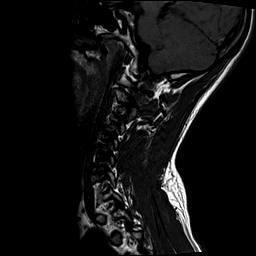
[im 5/13]
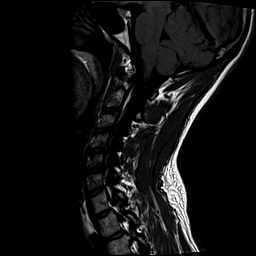
[im 7/13]
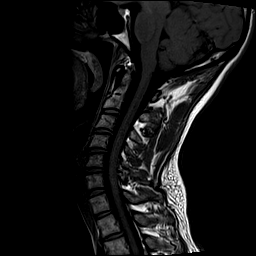
[im 9/13]
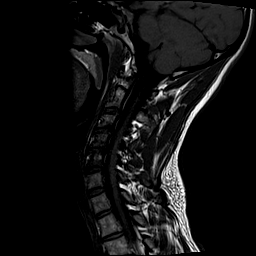
[im 11/13]
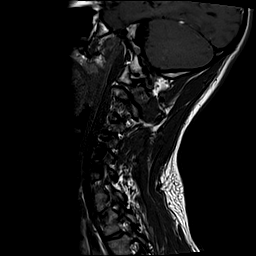
[im 13/13]
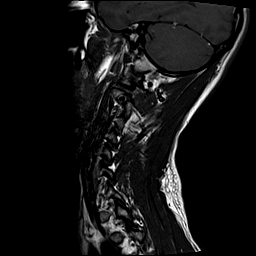

[Series 8: STIR · sagittal · 3.0mm · 0.69mm/px · 6 of 13 slices shown]
[im 1/13]
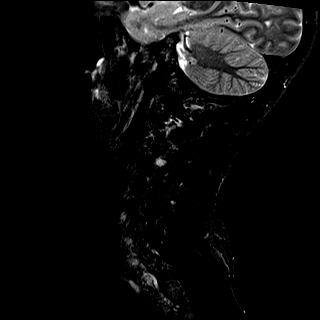
[im 3/13]
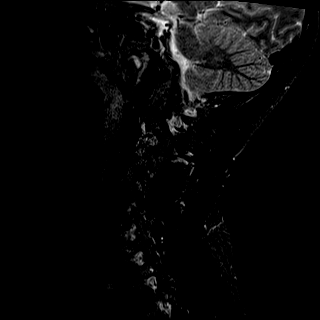
[im 5/13]
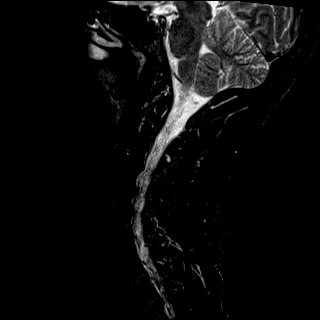
[im 8/13]
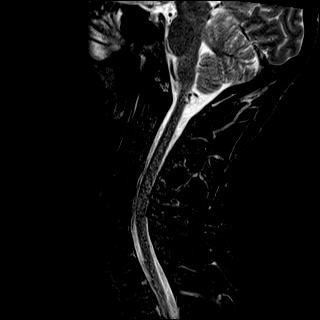
[im 10/13]
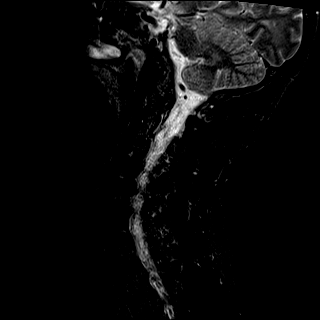
[im 13/13]
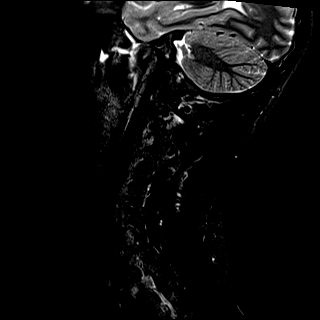

[Series 10: mpgr ax · axial · 3.0mm · 0.35mm/px · z∈[-181,-79]mm · 8 of 29 slices shown]
[im 1/29]
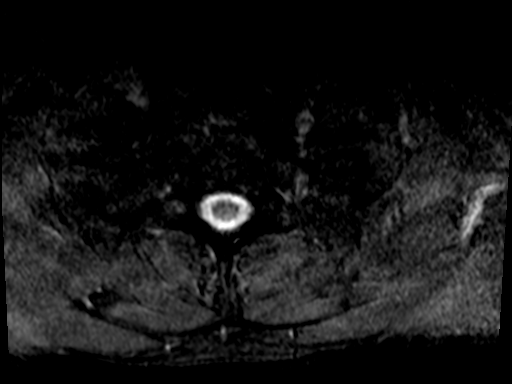
[im 5/29]
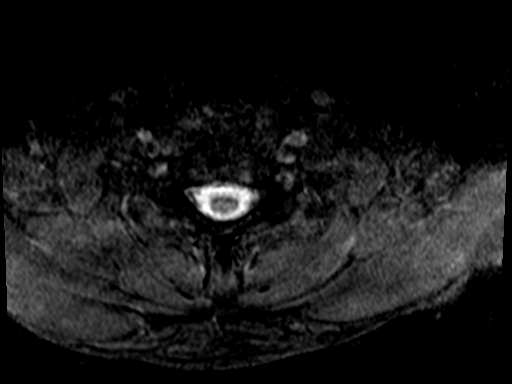
[im 9/29]
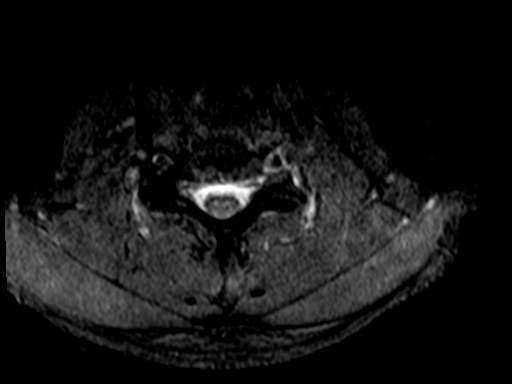
[im 13/29]
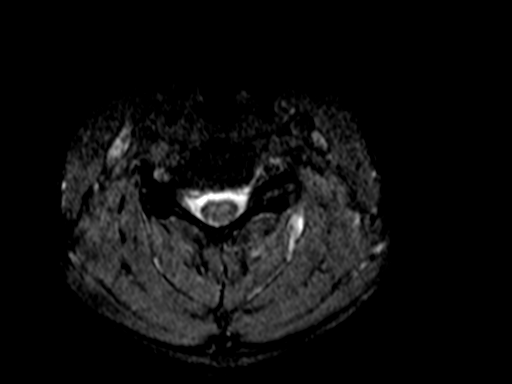
[im 16/29]
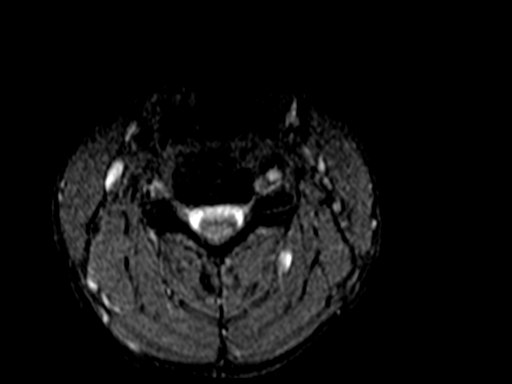
[im 20/29]
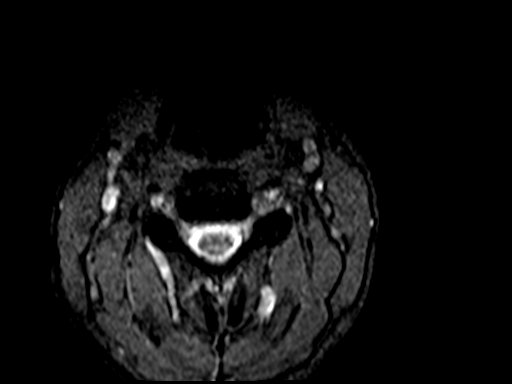
[im 24/29]
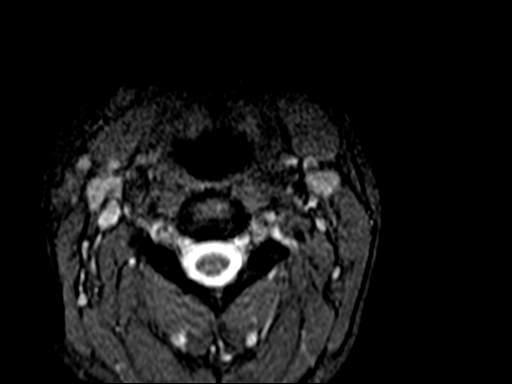
[im 29/29]
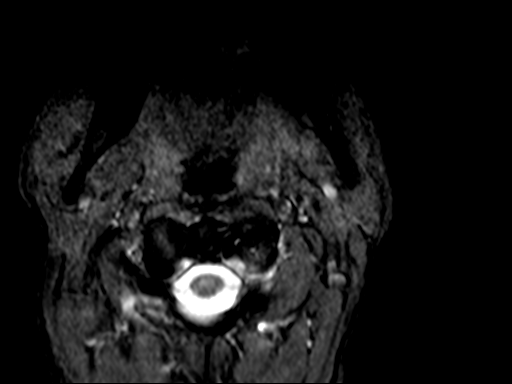

[Series 11: T2 · axial · 3.0mm · 0.62mm/px · z∈[-192,-91]mm · 11 of 29 slices shown (2 of 2)]
[im 1/29]
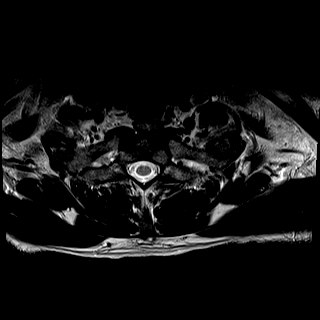
[im 3/29]
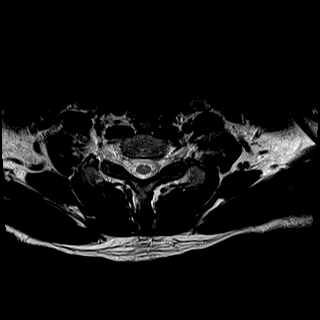
[im 5/29]
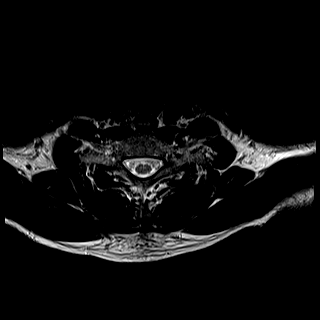
[im 7/29]
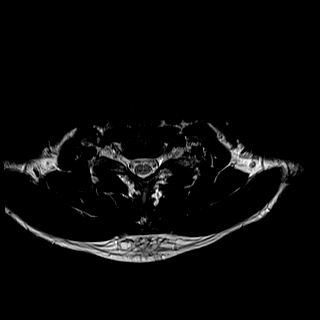
[im 9/29]
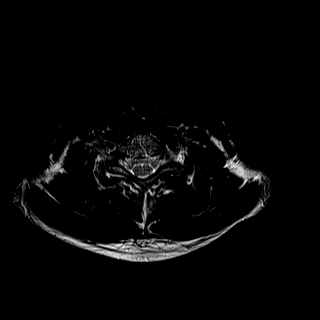
[im 11/29]
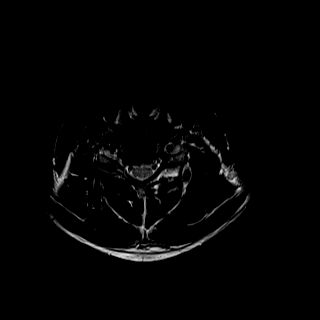
[im 13/29]
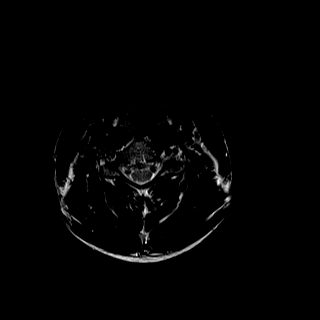
[im 16/29]
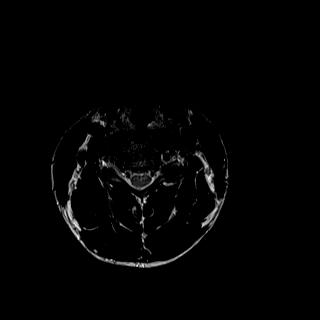
[im 20/29]
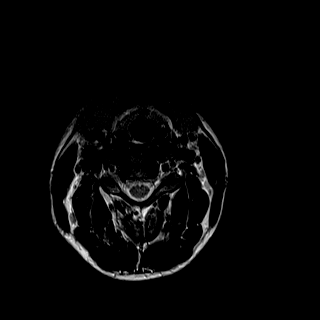
[im 24/29]
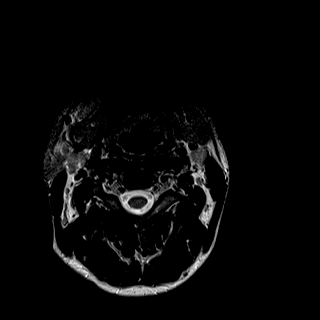
[im 29/29]
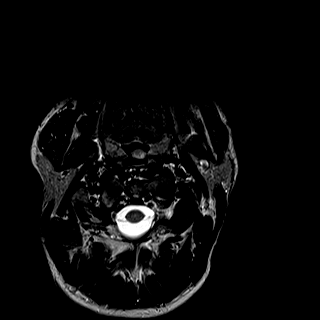

[39 of 48 positions shown; findings below may reference images not displayed]

FINDINGS: MRI HEAD FINDINGS

Brain: No diffusion-weighted signal abnormality. No intracranial
hemorrhage. No midline shift, ventriculomegaly or extra-axial fluid
collection. No mass lesion.

Vascular: Normal flow voids.

Skull and upper cervical spine: Normal marrow signal.

Sinuses/Orbits: Normal orbits. Minimal ethmoid and maxillary sinus
mucosal thickening. No mastoid effusion.

Other: None.

MRI CERVICAL SPINE FINDINGS

Alignment: Physiologic.

Vertebrae: Normal bone marrow signal intensity. No focal osseous
lesion.

Cord: Normal signal and morphology.

Posterior Fossa, vertebral arteries: Negative.

Disc levels: Negative.

C2-3: No significant disc bulge. Patent spinal canal and neural
foramen.

C3-4: Small disc osteophyte complex with uncovertebral and facet
degenerative spurring. Patent spinal canal and neural foramen.

C4-5: Small disc osteophyte complex with uncovertebral and facet
degenerative spurring. Patent spinal canal and right neural foramen.
Mild left neural foraminal narrowing.

C5-6: Shallow right paracentral protrusion with uncovertebral
degenerative spurring. Patent spinal canal and left neural foramen.
Mild right neural foraminal narrowing.

C6-7: No significant disc bulge. Patent spinal canal and neural
foramen.

C7-T1: No significant disc bulge. Patent spinal canal and neural
foramen.

Paraspinal tissues: Negative.
IMPRESSION: MRI head:

No evidence of acute or remote insult.  No abnormal enhancement.

MRI cervical spine:

No acute finding.

Mild multilevel degenerative changes. No significant spinal canal
narrowing or cord impingement.

Mild left C4-5 and right C5-6 neural foraminal narrowing.

## 2020-04-18 MED ORDER — GADOBUTROL 1 MMOL/ML IV SOLN
6.0000 mL | Freq: Once | INTRAVENOUS | Status: AC | PRN
  Administered 2020-04-18: 7.5 mL via INTRAVENOUS

## 2021-11-08 ENCOUNTER — Emergency Department (HOSPITAL_BASED_OUTPATIENT_CLINIC_OR_DEPARTMENT_OTHER)
Admission: EM | Admit: 2021-11-08 | Discharge: 2021-11-08 | Disposition: A | Discharge status: Home / Self Care | Payer: 59 | Attending: Emergency Medicine | Admitting: Emergency Medicine

## 2021-11-08 ENCOUNTER — Emergency Department (HOSPITAL_BASED_OUTPATIENT_CLINIC_OR_DEPARTMENT_OTHER): Payer: 59

## 2021-11-08 ENCOUNTER — Encounter (HOSPITAL_BASED_OUTPATIENT_CLINIC_OR_DEPARTMENT_OTHER): Payer: Self-pay | Admitting: Emergency Medicine

## 2021-11-08 ENCOUNTER — Other Ambulatory Visit: Payer: Self-pay

## 2021-11-08 DIAGNOSIS — R072 Precordial pain: Secondary | ICD-10-CM | POA: Diagnosis not present

## 2021-11-08 DIAGNOSIS — R071 Chest pain on breathing: Secondary | ICD-10-CM | POA: Diagnosis present

## 2021-11-08 LAB — CBC WITH DIFFERENTIAL/PLATELET
Abs Immature Granulocytes: 0.05 10*3/uL (ref 0.00–0.07)
Basophils Absolute: 0.1 10*3/uL (ref 0.0–0.1)
Basophils Relative: 1 %
Eosinophils Absolute: 0.2 10*3/uL (ref 0.0–0.5)
Eosinophils Relative: 3 %
HCT: 43.4 % (ref 39.0–52.0)
Hemoglobin: 15.1 g/dL (ref 13.0–17.0)
Immature Granulocytes: 1 %
Lymphocytes Relative: 45 %
Lymphs Abs: 3.2 10*3/uL (ref 0.7–4.0)
MCH: 30.4 pg (ref 26.0–34.0)
MCHC: 34.8 g/dL (ref 30.0–36.0)
MCV: 87.5 fL (ref 80.0–100.0)
Monocytes Absolute: 0.4 10*3/uL (ref 0.1–1.0)
Monocytes Relative: 5 %
Neutro Abs: 3.2 10*3/uL (ref 1.7–7.7)
Neutrophils Relative %: 45 %
Platelets: 167 10*3/uL (ref 150–400)
RBC: 4.96 MIL/uL (ref 4.22–5.81)
RDW: 13.2 % (ref 11.5–15.5)
WBC: 7 10*3/uL (ref 4.0–10.5)
nRBC: 0 % (ref 0.0–0.2)

## 2021-11-08 LAB — BASIC METABOLIC PANEL
Anion gap: 5 (ref 5–15)
BUN: 15 mg/dL (ref 6–20)
CO2: 22 mmol/L (ref 22–32)
Calcium: 8.5 mg/dL — ABNORMAL LOW (ref 8.9–10.3)
Chloride: 110 mmol/L (ref 98–111)
Creatinine, Ser: 0.99 mg/dL (ref 0.61–1.24)
GFR, Estimated: >60 mL/min (ref >60)
Glucose, Bld: 161 mg/dL — ABNORMAL HIGH (ref 70–99)
Potassium: 3.5 mmol/L (ref 3.5–5.1)
Sodium: 137 mmol/L (ref 135–145)

## 2021-11-08 LAB — TROPONIN I (HIGH SENSITIVITY)
Troponin I (High Sensitivity): 3 ng/L (ref <18)
Troponin I (High Sensitivity): <2 ng/L (ref <18)

## 2021-11-08 MED ORDER — ALUM & MAG HYDROXIDE-SIMETH 200-200-20 MG/5ML PO SUSP
30.0000 mL | Freq: Once | ORAL | Status: AC
  Administered 2021-11-08: 30 mL via ORAL
  Filled 2021-11-08: qty 30

## 2021-11-08 NOTE — ED Triage Notes (Signed)
Pt states he woke up this morning with pain in his left chest  Pt states  the pain lasted about 5-10 minutes  Pt adds he was sweaty with the pain  Pt states the pain is gone now   Pt states he is currently on antibiotics for an infection in his jaw

## 2021-11-08 NOTE — ED Provider Notes (Addendum)
[facility] EMERGENCY DEPARTMENT Provider Note   CSN: [id number] Arrival date & time: 11/08/21  0448     History  Chief Complaint  Patient presents with   Chest Pain    Terrelle Ruffolo is a 36 y.o. male.  The history is provided by the patient.  Chest Pain Pain location:  L chest Pain quality: dull   Pain radiates to:  Does not radiate Pain severity:  Moderate Onset quality:  Sudden Duration: 5-10 minutes at rest. Timing:  Constant Progression:  Resolved Chronicity:  Recurrent Context: at rest   Relieved by:  Nothing Worsened by:  Nothing Ineffective treatments:  None tried Associated symptoms: no cough, no fever, no heartburn, no lower extremity edema, no nausea, no palpitations, no shortness of breath, no vomiting and no weakness   Risk factors: male sex   Patient with dental infection changed to Augmentin yesterday afternoon presents with L chest pain that awoke him from sleep.  No exertional symptoms.  No SOB.  No n/v.  No travel.  No leg pain.  Negative nuclear stress test in August 2020.      Past Medical History:  Diagnosis Date   Allergy    Celiac disease    Celiac disease    Opiate overdose (HCC)     Home Medications Prior to Admission medications   Medication Sig Start Date End Date Taking? Authorizing Provider  albuterol (PROVENTIL HFA;VENTOLIN HFA) 108 (90 Base) MCG/ACT inhaler Inhale 2 puffs into the lungs every 4 (four) hours as needed for wheezing (cough, shortness of breath or wheezing.). 07/02/18   Everrett Coombe, DO  FLUoxetine (PROZAC) 20 MG capsule TAKE 1 CAPSULE BY MOUTH ONCE DAILY 07/22/18   Everrett Coombe, DO  hydrOXYzine (VISTARIL) 25 MG capsule Take 1 capsule (25 mg total) by mouth 3 (three) times daily as needed for anxiety. 07/03/18   Everrett Coombe, DO      Allergies    Other    Review of Systems   Review of Systems  Constitutional:  Negative for fever.  HENT:  Positive for dental problem. Negative for facial swelling.    Eyes:  Negative for redness.  Respiratory:  Negative for cough, shortness of breath, wheezing and stridor.   Cardiovascular:  Positive for chest pain. Negative for palpitations.  Gastrointestinal:  Negative for heartburn, nausea and vomiting.  Neurological:  Negative for weakness.  All other systems reviewed and are negative.   Physical Exam Updated Vital Signs BP (!) 146/77 (BP Location: Right Arm)   Pulse 60   Temp 97.7 F (36.5 C) (Oral)   Resp 15   Ht 5\' 8"  (1.727 m)   Wt 72.6 kg   SpO2 99%   BMI 24.33 kg/m  Physical Exam Vitals and nursing note reviewed. Exam conducted with a chaperone present.  Constitutional:      General: He is not in acute distress.    Appearance: Normal appearance. He is well-developed. He is not diaphoretic.  HENT:     Head: Normocephalic and atraumatic.     Nose: Nose normal.  Eyes:     Conjunctiva/sclera: Conjunctivae normal.     Pupils: Pupils are equal, round, and reactive to light.  Cardiovascular:     Rate and Rhythm: Normal rate and regular rhythm.     Pulses: Normal pulses.     Heart sounds: Normal heart sounds.  Pulmonary:     Effort: Pulmonary effort is normal.     Breath sounds: Normal breath sounds. No wheezing or  rales.  Abdominal:     General: Bowel sounds are normal.     Palpations: Abdomen is soft.     Tenderness: There is no abdominal tenderness. There is no guarding or rebound.  Musculoskeletal:        General: Normal range of motion.     Cervical back: Normal range of motion and neck supple.     Right lower leg: No swelling or tenderness. No edema.     Left lower leg: No swelling or tenderness. No edema.     Right ankle:     Right Achilles Tendon: Normal.     Left ankle:     Left Achilles Tendon: Normal.     Comments: Negative Homan's sign B  Skin:    General: Skin is warm and dry.     Capillary Refill: Capillary refill takes less than 2 seconds.  Neurological:     General: No focal deficit present.     Mental  Status: He is alert and oriented to person, place, and time.     Deep Tendon Reflexes: Reflexes normal.  Psychiatric:        Behavior: Behavior normal.        Thought Content: Thought content normal.     ED Results / Procedures / Treatments   Labs (all labs ordered are listed, but only abnormal results are displayed) Results for orders placed or performed during the hospital encounter of 11/08/21  CBC with Differential  Result Value Ref Range   WBC 7.0 4.0 - 10.5 K/uL   RBC 4.96 4.22 - 5.81 MIL/uL   Hemoglobin 15.1 13.0 - 17.0 g/dL   HCT 86.7 61.9 - 50.9 %   MCV 87.5 80.0 - 100.0 fL   MCH 30.4 26.0 - 34.0 pg   MCHC 34.8 30.0 - 36.0 g/dL   RDW 32.6 71.2 - 45.8 %   Platelets 167 150 - 400 K/uL   nRBC 0.0 0.0 - 0.2 %   Neutrophils Relative % 45 %   Neutro Abs 3.2 1.7 - 7.7 K/uL   Lymphocytes Relative 45 %   Lymphs Abs 3.2 0.7 - 4.0 K/uL   Monocytes Relative 5 %   Monocytes Absolute 0.4 0.1 - 1.0 K/uL   Eosinophils Relative 3 %   Eosinophils Absolute 0.2 0.0 - 0.5 K/uL   Basophils Relative 1 %   Basophils Absolute 0.1 0.0 - 0.1 K/uL   Immature Granulocytes 1 %   Abs Immature Granulocytes 0.05 0.00 - 0.07 K/uL  Basic metabolic panel  Result Value Ref Range   Sodium 137 135 - 145 mmol/L   Potassium 3.5 3.5 - 5.1 mmol/L   Chloride 110 98 - 111 mmol/L   CO2 22 22 - 32 mmol/L   Glucose, Bld 161 (H) 70 - 99 mg/dL   BUN 15 6 - 20 mg/dL   Creatinine, Ser 0.99 0.61 - 1.24 mg/dL   Calcium 8.5 (L) 8.9 - 10.3 mg/dL   GFR, Estimated >83 >38 mL/min   Anion gap 5 5 - 15  Troponin I (High Sensitivity)  Result Value Ref Range   Troponin I (High Sensitivity) 3 <18 ng/L   DG Chest Portable 1 View  Result Date: 11/08/2021 CLINICAL DATA:  Left-sided chest pain.  History of opioid overdose. EXAM: PORTABLE CHEST 1 VIEW COMPARISON:  11/12/2018 FINDINGS: Heart size and mediastinal contours are unremarkable. There is no pleural effusion or edema identified. No airspace opacities. The  visualized osseous structures are unremarkable. IMPRESSION: No acute cardiopulmonary abnormalities.  Electronically Signed   By: Signa Kell M.D.   On: 11/08/2021 05:23      EKG EKG Interpretation  Date/Time:  Wednesday November 08 2021 04:58:35 EDT Ventricular Rate:  68 PR Interval:  171 QRS Duration: 142 QT Interval:  414 QTC Calculation: 434 R Axis:   160 Text Interpretation: Sinus rhythm Nonspecific intraventricular conduction delay Confirmed by Nicanor Alcon, Bee Hammerschmidt ([id number]) on 11/08/2021 5:00:42 AM  Radiology No results found.  Procedures Procedures    Medications Ordered in ED Medications  alum & mag hydroxide-simeth (MAALOX/MYLANTA) 200-200-20 MG/5ML suspension 30 mL (30 mLs Oral Given 11/08/21 0503)    ED Course/ Medical Decision Making/ A&P                           Medical Decision Making Patient with sudden onset L CP that lasted 5-10 minutes at rest awoke from sleeping.   Amount and/or Complexity of Data Reviewed External Data Reviewed: notes.    Details: previous notes reviewed.  Outpatient stress test from August 2020 reviewed and was normal Labs: ordered.    Details: all labs reviewed: normal white count 7, normal hemoglobin 15.1 normal platelets.  Normal sodium 137 and potassium 3.5 and normal creatinine. .99 troponin is normal = 3 Radiology: ordered.    Details: No PNA by me on CXR ECG/medicine tests: ordered and independent interpretation performed. Decision-making details documented in ED Course.  Risk OTC drugs. Risk Details: Exam and vitals are benign and reassuring.  EKG is without change.  No exertional symptoms.  PERC negative wells negative highly doubt PE in this low risk patient.  Nighttime symptoms may be gerd, especially in light of antibiotics.  Signed out pending second troponin.      Final Clinical Impression(s) / ED Diagnoses Final diagnoses:  Precordial pain   Signed out to Dr. Karene Fry at 7 am pending delta troponin      Jaman Aro,  MD 11/08/21 0700

## 2022-04-23 ENCOUNTER — Emergency Department (HOSPITAL_BASED_OUTPATIENT_CLINIC_OR_DEPARTMENT_OTHER): Payer: Medicaid Other

## 2022-04-23 ENCOUNTER — Emergency Department (HOSPITAL_BASED_OUTPATIENT_CLINIC_OR_DEPARTMENT_OTHER)
Admission: EM | Admit: 2022-04-23 | Discharge: 2022-04-23 | Disposition: A | Discharge status: Home / Self Care | Payer: Medicaid Other | Attending: Emergency Medicine | Admitting: Emergency Medicine

## 2022-04-23 ENCOUNTER — Other Ambulatory Visit: Payer: Self-pay

## 2022-04-23 DIAGNOSIS — N433 Hydrocele, unspecified: Secondary | ICD-10-CM | POA: Diagnosis not present

## 2022-04-23 DIAGNOSIS — N451 Epididymitis: Secondary | ICD-10-CM

## 2022-04-23 DIAGNOSIS — M545 Low back pain, unspecified: Secondary | ICD-10-CM | POA: Diagnosis not present

## 2022-04-23 DIAGNOSIS — N50811 Right testicular pain: Secondary | ICD-10-CM | POA: Diagnosis present

## 2022-04-23 LAB — URINALYSIS, ROUTINE W REFLEX MICROSCOPIC
Bilirubin Urine: NEGATIVE
Glucose, UA: NEGATIVE mg/dL
Hgb urine dipstick: NEGATIVE
Ketones, ur: NEGATIVE mg/dL
Leukocytes,Ua: NEGATIVE
Nitrite: NEGATIVE
Protein, ur: NEGATIVE mg/dL
Specific Gravity, Urine: <=1.005 (ref 1.005–1.030)
pH: 5.5 (ref 5.0–8.0)

## 2022-04-23 MED ORDER — LIDOCAINE 5 % EX PTCH: 1.0000 | MEDICATED_PATCH | CUTANEOUS | 0 refills | Status: AC

## 2022-04-23 MED ORDER — DOXYCYCLINE HYCLATE 100 MG PO CAPS: 100.0000 mg | ORAL_CAPSULE | Freq: Two times a day (BID) | ORAL | 0 refills | Status: AC

## 2022-04-23 MED ORDER — CEFTRIAXONE SODIUM 500 MG IJ SOLR
500.0000 mg | Freq: Once | INTRAMUSCULAR | Status: AC
  Administered 2022-04-23: 500 mg via INTRAMUSCULAR
  Filled 2022-04-23: qty 500

## 2022-04-23 MED ORDER — DOXYCYCLINE HYCLATE 100 MG PO TABS
100.0000 mg | ORAL_TABLET | Freq: Once | ORAL | Status: AC
  Administered 2022-04-23: 100 mg via ORAL
  Filled 2022-04-23: qty 1

## 2022-04-23 MED ORDER — LIDOCAINE 5 % EX PTCH
1.0000 | MEDICATED_PATCH | CUTANEOUS | Status: DC
  Administered 2022-04-23: 1 via TRANSDERMAL
  Filled 2022-04-23: qty 1

## 2022-04-23 MED ORDER — LIDOCAINE HCL (PF) 1 % IJ SOLN
INTRAMUSCULAR | Status: AC
  Filled 2022-04-23: qty 5

## 2022-04-23 NOTE — Discharge Instructions (Addendum)
You are seen today in the emergency department for low back pain.  I suspect this is mostly strain, he should continue stretching and using your back although avoid lifting anything too heavy.  You can take Tylenol Motrin for pain, you can also use the Lidoderm patches if they help.  You have an epididymitis of the scrotum as we discussed, you will need to take doxycycline twice daily for the next 10 days to cure it up.  If he continues having symptoms in your testicle area you should follow-up with urologist, information above to establish care.  Return to the ED for inability urinate, severe abdominal pain, severe back pain, loss of bladder or bowel function, blood in the urine, fevers or new or concerning symptoms.

## 2022-04-23 NOTE — ED Provider Notes (Signed)
[facility] EMERGENCY DEPARTMENT Provider Note   CSN: [id number] Arrival date & time: 04/23/22  1100     History  Chief Complaint  Patient presents with   Back Pain    Justin Bass is a 37 y.o. male.   Back Pain    Patient presents to the emergency department due to back pain and right testicle pain.  Started 5 days ago after lifting something heavy at work.  The pain into his back is reproducible.  Still having pain that radiates into the right side of his groin.  Towards movement, denies any lateralized numbness or weakness, bilateral lower extremity weakness, urinary tension, fecal incontinence.  No history of back surgery, malignancy, chemotherapy.  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  Denies previous final surgeries.  He denies any dysuria or hematuria, patient is in a monogamous relationship with his wife no concern for STDs.  Denies dysuria or hematuria.  No rashes to the back.  Home Medications Prior to Admission medications   Medication Sig Start Date End Date Taking? Authorizing Provider  doxycycline (VIBRAMYCIN) 100 MG capsule Take 1 capsule (100 mg total) by mouth 2 (two) times daily. 04/23/22  Yes Sherrill Raring, PA-C  lidocaine (LIDODERM) 5 % Place 1 patch onto the skin daily. Remove & Discard patch within 12 hours or as directed by MD 04/23/22  Yes Sherrill Raring, PA-C  albuterol (PROVENTIL HFA;VENTOLIN HFA) 108 (90 Base) MCG/ACT inhaler Inhale 2 puffs into the lungs every 4 (four) hours as needed for wheezing (cough, shortness of breath or wheezing.). 07/02/18   Luetta Nutting, DO  FLUoxetine (PROZAC) 20 MG capsule TAKE 1 CAPSULE BY MOUTH ONCE DAILY 07/22/18   Luetta Nutting, DO  hydrOXYzine (VISTARIL) 25 MG capsule Take 1 capsule (25 mg total) by mouth 3 (three) times daily as needed for anxiety. 07/03/18   Luetta Nutting, DO      Allergies    Other    Review of Systems   Review of Systems  Musculoskeletal:  Positive for  back pain.    Physical Exam Updated Vital Signs BP 132/73   Pulse 81   Temp 98 F (36.7 C) (Oral)   Resp 17   Ht 5\' 8"  (1.727 m)   Wt 61.2 kg   SpO2 99%   BMI 20.53 kg/m  Physical Exam Vitals and nursing note reviewed. Exam conducted with a chaperone present.  Constitutional:      Appearance: Normal appearance.  HENT:     Head: Normocephalic and atraumatic.  Eyes:     General: No scleral icterus.       Right eye: No discharge.        Left eye: No discharge.     Extraocular Movements: Extraocular movements intact.     Pupils: Pupils are equal, round, and reactive to light.  Cardiovascular:     Rate and Rhythm: Normal rate and regular rhythm.     Pulses: Normal pulses.     Heart sounds: Normal heart sounds. No murmur heard.    No friction rub. No gallop.  Pulmonary:     Effort: Pulmonary effort is normal. No respiratory distress.     Breath sounds: Normal breath sounds.  Abdominal:     General: Abdomen is flat. Bowel sounds are normal. There is no distension.     Palpations: Abdomen is soft.     Tenderness: There is no abdominal tenderness.  Genitourinary:    Comments: Genital  exam performed with chaperone Earl Lites in room.  Right testicle is erythematous and slightly enlarged compared to left.  Urethra patent without surrounding urethritis Musculoskeletal:        General: Tenderness present.     Comments: Midline tenderness L4-L5 without any palpable step-offs or crepitus.    Skin:    General: Skin is warm and dry.     Coloration: Skin is not jaundiced.  Neurological:     Mental Status: He is alert. Mental status is at baseline.     Coordination: Coordination normal.     Comments: Cranial nerves II through XII are grossly intact.  Upper and lower extremity strength is symmetric bilaterally.  Ambulatory with steady gait.       ED Results / Procedures / Treatments   Labs (all labs ordered are listed, but only abnormal results are displayed) Labs Reviewed   URINALYSIS, ROUTINE W REFLEX MICROSCOPIC  GC/CHLAMYDIA PROBE AMP ([place]) NOT AT [facility]    EKG None  Radiology US SCROTUM W/DOPPLER  Result Date: 04/23/2022 CLINICAL DATA:  RIGHT testicular pain for 2 days EXAM: SCROTAL ULTRASOUND DOPPLER ULTRASOUND OF THE TESTICLES TECHNIQUE: Complete ultrasound examination of the testicles, epididymis, and other scrotal structures was performed. Color and spectral Doppler ultrasound were also utilized to evaluate blood flow to the testicles. COMPARISON:  03/10/2017 FINDINGS: Right testicle Measurements: 4.1 x 2.6 x 3.0 cm. Normal echogenicity without mass or calcification. Internal blood flow present on color Doppler imaging. Left testicle Measurements: 3.9 x 2.6 x 3.1 cm. Normal echogenicity without mass or calcification. Internal blood flow present on color Doppler imaging. Right epididymis:  Hypervascular.  No discrete mass. Left epididymis:  Normal in size and appearance. Hydrocele:  Small RIGHT hydrocele Varicocele: None definitely visualized. RIGHT spermatic cord is hypervascular on color Doppler imaging though this may be related to hypervascularity of the RIGHT epididymis. Pulsed Doppler interrogation of both testes demonstrates normal low resistance arterial and venous waveforms bilaterally. IMPRESSION: Hypervascular RIGHT epididymis consistent with epididymitis. Small RIGHT hydrocele. No evidence of testicular mass or torsion. Electronically Signed   By: Ulyses Southward M.D.   On: 04/23/2022 12:06   DG Lumbar Spine Complete  Result Date: 04/23/2022 CLINICAL DATA:  Low back pain. EXAM: LUMBAR SPINE - COMPLETE 4+ VIEW COMPARISON:  None Available. FINDINGS: Normal alignment of the lumbar vertebral bodies. Disc spaces and vertebral bodies are maintained. The facets are normally aligned. No pars defects. The visualized bony pelvis is intact. IMPRESSION: Normal alignment and no acute bony findings or degenerative changes. Electronically Signed   By: Rudie Meyer M.D.   On: 04/23/2022 11:47    Procedures Procedures    Medications Ordered in ED Medications  lidocaine (LIDODERM) 5 % 1 patch (1 patch Transdermal Patch Applied 04/23/22 1222)  cefTRIAXone (ROCEPHIN) injection 500 mg (500 mg Intramuscular Given 04/23/22 1311)  doxycycline (VIBRA-TABS) tablet 100 mg (100 mg Oral Given 04/23/22 1311)  lidocaine (PF) (XYLOCAINE) 1 % injection (  Given 04/23/22 1315)    ED Course/ Medical Decision Making/ A&P                             Medical Decision Making Amount and/or Complexity of Data Reviewed Labs: ordered. Radiology: ordered.  Risk Prescription drug management.   Patient presents due to low back pain and testicle pain.  Differential includes but not limited to cauda equina, epidural abscess, cord compression, muscle strain, compression fracture, testicular torsion, epididymitis, UTI, STD,  hernia.  Patient is afebrile, nonseptic appearing.  His neuroexam is very reassuring.  Other does have history of IV drug use she has been in remission for many years and symptoms do not appear consistent with an epidural abscess.  Additionally I doubt cauda equina or compression fracture based on the etiology.  I will get a plain film x-ray although high some ice is differential for the low back pain would be muscle strain from heavy lifting.  Regarding the testicular pain concern for testicular torsion, will proceed with Doppler ultrasound to evaluate.  He does have erythema to the scrotal sac as well as some increase size and and I am able to palpate what feels like a hydrocele.  Does not really have any risk factors for STDs.    I ordered, viewed and interpreted laboratory workup.  UA is negative for underlying infectious symptoms.  GC, he is pending.  Ultrasound is notable for hydrocele as well as epididymitis.  Plain film of the low back is negative for any acute process.  I reevaluated the patient updated him on the findings.  We discussed  plan, will give 1 send Rocephin and doxycycline here to cover for bacterial etiology.  The epididymitis.  Urology follow-up was provided.  Patient stable for discharge at this time.        Final Clinical Impression(s) / ED Diagnoses Final diagnoses:  Hydrocele, unspecified hydrocele type  Epididymitis  Acute midline low back pain without sciatica    Rx / DC Orders ED Discharge Orders          Ordered    doxycycline (VIBRAMYCIN) 100 MG capsule  2 times daily        04/23/22 1253    lidocaine (LIDODERM) 5 %  Every 24 hours        04/23/22 1253              Sherrill Raring, PA-C 04/23/22 1543    Malvin Johns, MD 04/24/22 [phone]

## 2022-04-23 NOTE — ED Triage Notes (Signed)
Patient presents to ED via POV from home. Here with back pain and right sided testicle swelling that began on Thursday. Denies recent injury or trauma. Denies urinary incontinence or urinary symptoms.

## 2023-07-28 ENCOUNTER — Encounter (HOSPITAL_BASED_OUTPATIENT_CLINIC_OR_DEPARTMENT_OTHER): Payer: Self-pay | Admitting: Emergency Medicine

## 2023-07-28 ENCOUNTER — Emergency Department (HOSPITAL_BASED_OUTPATIENT_CLINIC_OR_DEPARTMENT_OTHER)
Admission: EM | Admit: 2023-07-28 | Discharge: 2023-07-28 | Disposition: A | Discharge status: Home / Self Care | Attending: Emergency Medicine | Admitting: Emergency Medicine

## 2023-07-28 DIAGNOSIS — W292XXA Contact with other powered household machinery, initial encounter: Secondary | ICD-10-CM | POA: Insufficient documentation

## 2023-07-28 DIAGNOSIS — S61411A Laceration without foreign body of right hand, initial encounter: Secondary | ICD-10-CM | POA: Diagnosis not present

## 2023-07-28 DIAGNOSIS — S6991XA Unspecified injury of right wrist, hand and finger(s), initial encounter: Secondary | ICD-10-CM | POA: Diagnosis present

## 2023-07-28 MED ORDER — LIDOCAINE HCL (PF) 1 % IJ SOLN
5.0000 mL | Freq: Once | INTRAMUSCULAR | Status: AC
  Administered 2023-07-28: 5 mL
  Filled 2023-07-28: qty 5

## 2023-07-28 MED ORDER — BACITRACIN ZINC 500 UNIT/GM EX OINT: TOPICAL_OINTMENT | Freq: Two times a day (BID) | CUTANEOUS | Status: DC

## 2023-07-28 NOTE — ED Notes (Signed)
 ED Provider at bedside.

## 2023-07-28 NOTE — ED Provider Notes (Signed)
 [facility] Provider Note   CSN: [id number] Arrival date & time: 07/28/23  1509     History  Chief Complaint  Patient presents with   Laceration    Justin Bass is a 38 y.o. male presented after cutting his right palm on a piece of metal while working on a washer machine earlier this morning.  Patient's tetanus was last year.  Patient still feel move his hand however states that due to the cut still bleeding after prompting him to be seen.  Patient is not on any blood thinners.  Patient denies any paresthesias or new onset weakness.   Home Medications Prior to Admission medications   Medication Sig Start Date End Date Taking? Authorizing Provider  albuterol  (PROVENTIL  HFA;VENTOLIN  HFA) 108 (90 Base) MCG/ACT inhaler Inhale 2 puffs into the lungs every 4 (four) hours as needed for wheezing (cough, shortness of breath or wheezing.). 07/02/18   Adela Holter, DO  doxycycline  (VIBRAMYCIN ) 100 MG capsule Take 1 capsule (100 mg total) by mouth 2 (two) times daily. 04/23/22   Delray Fielding, PA-C  FLUoxetine  (PROZAC ) 20 MG capsule TAKE 1 CAPSULE BY MOUTH ONCE DAILY 07/22/18   Adela Holter, DO  hydrOXYzine  (VISTARIL ) 25 MG capsule Take 1 capsule (25 mg total) by mouth 3 (three) times daily as needed for anxiety. 07/03/18   Adela Holter, DO  lidocaine  (LIDODERM ) 5 % Place 1 patch onto the skin daily. Remove & Discard patch within 12 hours or as directed by MD 04/23/22   Delray Fielding, PA-C      Allergies    Other    Review of Systems   Review of Systems  Physical Exam Updated Vital Signs BP 117/73   Pulse 82   Temp 98 F (36.7 C)   Resp 16   Ht 5\' 8"  (1.727 m)   Wt 79.4 kg   SpO2 98%   BMI 26.61 kg/m  Physical Exam Vitals reviewed.  Constitutional:      General: He is not in acute distress. Cardiovascular:     Rate and Rhythm: Normal rate.     Pulses: Normal pulses.  Musculoskeletal:     Comments: Right palm: Superficial  laceration with no bony or tendon exposure, 5 out of 5 grip strength Pain not out of proportion Soft compartments  Skin:    General: Skin is warm and dry.     Capillary Refill: Capillary refill takes less than 2 seconds.     Comments: 1.5 cm superficial laceration to thenar aspect of right hand  Neurological:     Mental Status: He is alert.     Comments: Sensation intact distally  Psychiatric:        Mood and Affect: Mood normal.     ED Results / Procedures / Treatments   Labs (all labs ordered are listed, but only abnormal results are displayed) Labs Reviewed - No data to display  EKG None  Radiology No results found.  Procedures .Laceration Repair  Date/Time: 07/28/2023 4:14 PM  Performed by: Denese Finn, PA-C Authorized by: Denese Finn, PA-C   Consent:    Consent obtained:  Verbal   Consent given by:  Patient   Risks, benefits, and alternatives were discussed: yes     Risks discussed:  Infection, need for additional repair, nerve damage, poor wound healing, poor cosmetic result, pain, retained foreign body, vascular damage and tendon damage   Alternatives discussed:  No treatment Universal protocol:  Procedure explained and questions answered to patient or proxy's satisfaction: yes     Immediately prior to procedure, a time out was called: yes     Patient identity confirmed:  Verbally with patient Anesthesia:    Anesthesia method:  Local infiltration   Local anesthetic:  Lidocaine  1% w/o epi Laceration details:    Location:  Hand   Hand location:  R palm   Length (cm):  1.5   Depth (mm):  1 Treatment:    Area cleansed with:  Saline   Amount of cleaning:  Standard   Irrigation solution:  Sterile saline   Irrigation volume:  150 cc   Irrigation method:  Pressure wash   Visualized foreign bodies/material removed: no     Debridement:  None Skin repair:    Repair method:  Sutures   Suture size:  5-0   Suture material:  Prolene   Suture  technique:  Simple interrupted   Number of sutures:  2 Approximation:    Approximation:  Close Repair type:    Repair type:  Simple Post-procedure details:    Dressing:  Antibiotic ointment, bulky dressing and non-adherent dressing   Procedure completion:  Tolerated     Medications Ordered in ED Medications  bacitracin  ointment (has no administration in time range)  lidocaine  (PF) (XYLOCAINE ) 1 % injection 5 mL (5 mLs Infiltration Given by Other 07/28/23 1543)    ED Course/ Medical Decision Making/ A&P                                 Medical Decision Making Risk OTC drugs. Prescription drug management.   Eldridge Greig 38 y.o. presented today for a laceration. Working DDx that I considered at this time includes, but not limited to, FB, fracture, NV compromise, simple laceration.  R/o DDx: FB, fracture, NV compromise: These are considered less likely due to history of present illness and physical exam findings  Review of prior external notes: 05/28/2023 office visit  Unique Tests and My Independent Interpretation: None  Social Determinants of Health: none  Discussion with Independent Historian: None  Discussion of Management of Tests: None  Risk: Low: based on diagnostic testing/clinical impression and treatment plan  Risk Stratification Score: None  Plan: Patient is in no acute distress and has stable vitals. They are neurovascularly intact. Tetanus is up to date. Patient is in no distress. Laceration will be repaired with standard wound care procedures and antibiotic ointment.  I spoke to the patient about the risk and benefit of doing a laceration repair including hemorrhage, scarring, pain, foreign body to which patient verbalized their understanding and acceptance of and would like to proceed.  Patient has a slicing mechanism to his hand and that at this point very low suspicion of foreign body or need for imaging given the superficial nature of the cut.  Patient  agrees.  Wound is less than 12 hours old on the body and can repair.  Patient/family educated about specific return precautions for given chief complaint and symptoms.  Patient/family educated about follow-up with PCP.  Patient was educated to have sutures removed in Digits, palm, and sole - 10 to 14 days.  Laceration repaired without complication and with pulse, motor, sensation intact.  Patient is stable for discharge at this time. Patient/family expressed understanding of return precautions and need for follow-up. Patient spoken to regarding appropriate follow up for these results. All education provided in verbal  form with additional information in written form. Time was allowed for answering of patient questions. Patient discharged.   This chart was dictated using voice recognition software.  Despite best efforts to proofread,  errors can occur which can change the documentation meaning.        Final Clinical Impression(s) / ED Diagnoses Final diagnoses:  Laceration of right hand, foreign body presence unspecified, initial encounter    Rx / DC Orders ED Discharge Orders     None         Elex Grimmer 07/28/23 1617    Arvilla Birmingham, MD 07/28/23 [phone]

## 2023-07-28 NOTE — Discharge Instructions (Addendum)
 You have been seen in the Emergency Department (ED) today for a laceration (cut).  Please keep the cut clean but do not submerge it in the water.  It has been repaired with 2 staples or sutures that will need to be removed in about Digits, palm, and sole - 10 to 14 days days. Please follow up with your doctor, an urgent care, or return to the ED for suture removal.    Please take Tylenol  (acetaminophen ) or Motrin  (ibuprofen ) as needed for discomfort as written on the box.   Please follow up with your doctor as soon as possible regarding today's emergent visit.   Return to the ED or call your doctor if you notice any signs of infection such as fever, increased pain, increased redness, pus, or other symptoms that concern you.

## 2023-07-28 NOTE — ED Triage Notes (Signed)
 Pt with laceration to RT palm from a piece of steel while working on a washing machine

## 2024-05-10 DEATH — deceased
